# Patient Record
Sex: Female | Born: 1975 | Race: White | Hispanic: No | Marital: Single | Smoking: Current every day smoker
Health system: Southern US, Community
[De-identification: ages and names within clinical notes are randomized; demographics above are authoritative.]

## PROBLEM LIST (undated history)

## (undated) DIAGNOSIS — E538 Deficiency of other specified B group vitamins: Secondary | ICD-10-CM

## (undated) DIAGNOSIS — E669 Obesity, unspecified: Secondary | ICD-10-CM

## (undated) DIAGNOSIS — G8929 Other chronic pain: Secondary | ICD-10-CM

## (undated) DIAGNOSIS — K5090 Crohn's disease, unspecified, without complications: Secondary | ICD-10-CM

## (undated) DIAGNOSIS — K219 Gastro-esophageal reflux disease without esophagitis: Secondary | ICD-10-CM

## (undated) DIAGNOSIS — R109 Unspecified abdominal pain: Secondary | ICD-10-CM

## (undated) HISTORY — DX: Gastro-esophageal reflux disease without esophagitis: K21.9

---

## 1998-05-07 ENCOUNTER — Encounter: Payer: Self-pay | Admitting: Obstetrics and Gynecology

## 1998-05-07 ENCOUNTER — Ambulatory Visit (HOSPITAL_COMMUNITY): Admission: RE | Admit: 1998-05-07 | Discharge: 1998-05-07 | Payer: Self-pay | Admitting: Obstetrics and Gynecology

## 1998-08-13 ENCOUNTER — Inpatient Hospital Stay (HOSPITAL_COMMUNITY): Admission: AD | Admit: 1998-08-13 | Discharge: 1998-08-13 | Payer: Self-pay | Admitting: Obstetrics and Gynecology

## 1998-08-14 ENCOUNTER — Inpatient Hospital Stay (HOSPITAL_COMMUNITY): Admission: AD | Admit: 1998-08-14 | Discharge: 1998-08-14 | Payer: Self-pay | Admitting: Obstetrics and Gynecology

## 1998-09-10 ENCOUNTER — Ambulatory Visit (HOSPITAL_COMMUNITY): Admission: RE | Admit: 1998-09-10 | Discharge: 1998-09-10 | Payer: Self-pay | Admitting: Obstetrics and Gynecology

## 1998-09-15 ENCOUNTER — Inpatient Hospital Stay (HOSPITAL_COMMUNITY): Admission: AD | Admit: 1998-09-15 | Discharge: 1998-09-15 | Payer: Self-pay | Admitting: Obstetrics and Gynecology

## 1998-09-17 ENCOUNTER — Inpatient Hospital Stay (HOSPITAL_COMMUNITY): Admission: AD | Admit: 1998-09-17 | Discharge: 1998-09-20 | Payer: Self-pay | Admitting: Obstetrics and Gynecology

## 1998-09-22 ENCOUNTER — Encounter: Payer: Self-pay | Admitting: Obstetrics and Gynecology

## 1998-09-22 ENCOUNTER — Emergency Department (HOSPITAL_COMMUNITY): Admission: EM | Admit: 1998-09-22 | Discharge: 1998-09-22 | Payer: Self-pay | Admitting: Emergency Medicine

## 2000-08-08 ENCOUNTER — Other Ambulatory Visit: Admission: RE | Admit: 2000-08-08 | Discharge: 2000-08-08 | Payer: Self-pay | Admitting: Obstetrics and Gynecology

## 2001-04-21 ENCOUNTER — Encounter: Payer: Self-pay | Admitting: Obstetrics and Gynecology

## 2001-04-21 ENCOUNTER — Ambulatory Visit (HOSPITAL_COMMUNITY): Admission: RE | Admit: 2001-04-21 | Discharge: 2001-04-21 | Payer: Self-pay | Admitting: Obstetrics and Gynecology

## 2001-08-17 ENCOUNTER — Ambulatory Visit (HOSPITAL_COMMUNITY): Admission: RE | Admit: 2001-08-17 | Discharge: 2001-08-17 | Payer: Self-pay | Admitting: Obstetrics and Gynecology

## 2001-08-17 ENCOUNTER — Encounter: Payer: Self-pay | Admitting: Obstetrics and Gynecology

## 2001-08-26 ENCOUNTER — Inpatient Hospital Stay (HOSPITAL_COMMUNITY): Admission: AD | Admit: 2001-08-26 | Discharge: 2001-08-26 | Payer: Self-pay | Admitting: Obstetrics and Gynecology

## 2001-08-31 ENCOUNTER — Observation Stay (HOSPITAL_COMMUNITY): Admission: AD | Admit: 2001-08-31 | Discharge: 2001-09-01 | Payer: Self-pay | Admitting: Obstetrics and Gynecology

## 2001-09-05 ENCOUNTER — Encounter (INDEPENDENT_AMBULATORY_CARE_PROVIDER_SITE_OTHER): Payer: Self-pay

## 2001-09-05 ENCOUNTER — Inpatient Hospital Stay (HOSPITAL_COMMUNITY): Admission: AD | Admit: 2001-09-05 | Discharge: 2001-09-08 | Payer: Self-pay | Admitting: Obstetrics and Gynecology

## 2001-10-23 ENCOUNTER — Other Ambulatory Visit: Admission: RE | Admit: 2001-10-23 | Discharge: 2001-10-23 | Payer: Self-pay | Admitting: Obstetrics and Gynecology

## 2007-12-08 ENCOUNTER — Emergency Department (HOSPITAL_COMMUNITY): Admission: EM | Admit: 2007-12-08 | Discharge: 2007-12-09 | Payer: Self-pay | Admitting: Emergency Medicine

## 2008-03-29 HISTORY — PX: LAPAROSCOPIC CHOLECYSTECTOMY: SUR755

## 2008-08-27 ENCOUNTER — Ambulatory Visit (HOSPITAL_COMMUNITY): Admission: RE | Admit: 2008-08-27 | Discharge: 2008-08-27 | Payer: Self-pay | Admitting: Obstetrics and Gynecology

## 2008-08-27 ENCOUNTER — Encounter (INDEPENDENT_AMBULATORY_CARE_PROVIDER_SITE_OTHER): Payer: Self-pay | Admitting: Obstetrics and Gynecology

## 2008-10-15 ENCOUNTER — Emergency Department (HOSPITAL_COMMUNITY): Admission: EM | Admit: 2008-10-15 | Discharge: 2008-10-15 | Payer: Self-pay | Admitting: Emergency Medicine

## 2008-11-21 ENCOUNTER — Inpatient Hospital Stay (HOSPITAL_COMMUNITY): Admission: AD | Admit: 2008-11-21 | Discharge: 2008-11-21 | Payer: Self-pay | Admitting: Obstetrics and Gynecology

## 2009-03-29 DIAGNOSIS — E669 Obesity, unspecified: Secondary | ICD-10-CM

## 2009-03-29 HISTORY — DX: Obesity, unspecified: E66.9

## 2009-08-22 ENCOUNTER — Encounter: Payer: Self-pay | Admitting: Internal Medicine

## 2009-09-02 ENCOUNTER — Encounter: Payer: Self-pay | Admitting: Internal Medicine

## 2009-09-03 ENCOUNTER — Encounter: Payer: Self-pay | Admitting: Internal Medicine

## 2009-09-05 ENCOUNTER — Encounter: Payer: Self-pay | Admitting: Internal Medicine

## 2009-09-19 ENCOUNTER — Ambulatory Visit: Payer: Self-pay | Admitting: Internal Medicine

## 2009-09-19 DIAGNOSIS — R079 Chest pain, unspecified: Secondary | ICD-10-CM

## 2009-09-19 DIAGNOSIS — R0602 Shortness of breath: Secondary | ICD-10-CM

## 2009-09-22 ENCOUNTER — Encounter: Payer: Self-pay | Admitting: Internal Medicine

## 2009-09-22 LAB — CONVERTED CEMR LAB
BUN: 9 mg/dL (ref 6–23)
Chloride: 104 meq/L (ref 96–112)
Eosinophils Relative: 3 % (ref 0–5)
Glucose, Bld: 93 mg/dL (ref 70–99)
HCT: 34.2 % — ABNORMAL LOW (ref 36.0–46.0)
Hemoglobin: 11.5 g/dL — ABNORMAL LOW (ref 12.0–15.0)
Lymphocytes Relative: 24 % (ref 12–46)
Lymphs Abs: 2.3 10*3/uL (ref 0.7–4.0)
Monocytes Absolute: 0.4 10*3/uL (ref 0.1–1.0)
Monocytes Relative: 4 % (ref 3–12)
Potassium: 4.9 meq/L (ref 3.5–5.3)
RBC: 3.83 M/uL — ABNORMAL LOW (ref 3.87–5.11)
Sodium: 138 meq/L (ref 135–145)
WBC: 9.6 10*3/uL (ref 4.0–10.5)

## 2010-04-19 ENCOUNTER — Encounter: Payer: Self-pay | Admitting: Obstetrics and Gynecology

## 2010-04-28 NOTE — Assessment & Plan Note (Signed)
Summary: NP6/SOB/CHEST PAIN/JML   Visit Type:  Initial Consult Referring Provider:  dr Gabriel Cirri Primary Provider:  dr Gabriel Cirri   History of Present Illness: Patient is  a 35 year old who was referred for evaluation of chest tightness.   The patient has no known history of CAD.  She said since March or April she has noticed that she has not felt well.  She is more tired, gets short of breath easy.  Feels heaviness in her chest that will last all day. Also notes LE edema.  IN the last week she has gained 8 lbs. No history of recent viral URI.  No palpitations.  No syncope. Outside records show that she underwent a myovew.  Rest images only   There was an apical, anteroseptal and infreior defect.  The patient did not complete the study.  Current Medications (verified): 1)  Alprazolam 0.5 Mg Tabs (Alprazolam) .Marland Kitchen.. 1 Tab Two Times A Day 2)  Lamictal 25 Mg Tabs (Lamotrigine) .... 2 Tabs Once Daily 3)  Dexilant 60 Mg Cpdr (Dexlansoprazole) .Marland Kitchen.. 1 Tab Once Daily 4)  Byetta 5 Mcg Pen 5 Mcg/0.54ml Soln (Exenatide) .... Two Times A Day 5)  Pre Natal Vitamin .Marland Kitchen.. 1 Tab Once Daily  Allergies (verified): 1)  ! Sulfa  Past History:  Family History: Last updated: 09/19/2009 Coronary Artery disease HTN Grandmother with CHF Father with MI at 27.  Hx of CHF.  Social History: Last updated: 09/19/2009 tobacco use marital status Single caffeine use no alcohol use no drug use 2 children  Past Medical History: migraine obesity disorder, depressive, bipolar rhinitis allergic  esophageal reflux anxiety  Past Surgical History: Gallbladder-02/28/2009 Tubal Ligation  Family History: Coronary Artery disease HTN Grandmother with CHF Father with MI at 16.  Hx of CHF.  Review of Systems       All systems reviewed.  Neg to the above problem.  Vital Signs:  Patient profile:   35 year old female Height:      63 inches Weight:      314 pounds BMI:     55.82 Pulse rate:    90 / minute BP sitting:   120 / 73  (left arm) Cuff size:   large  Vitals Entered By: Burnett Kanaris, CNA (September 19, 2009 3:13 PM)  Physical Exam  Additional Exam:  Patient is a morbidly obese 35 year old. HEENT:  Normocephalic, atraumatic. EOMI, PERRLA.  Neck: JVP is normal. No thyromegaly. No bruits.  Lungs: clear to auscultation. No rales no wheezes.  Heart: Regular rate and rhythm. Normal S1, S2. No S3.   No significant murmurs. PMI not displaced.  Abdomen:  Supple, nontender. Normal bowel sounds. No masses. No hepatomegaly.  Extremities:   Good distal pulses throughout. Triviial lower extremity edema.  Musculoskeletal :moving all extremities.  Neuro:   alert and oriented x3.    EKG  Procedure date:  09/19/2009  Findings:      NSR.  84 bpm.  Impression & Recommendations:  Problem # 1:  SHORTNESS OF BREATH (ICD-786.05) Patients volume status does not look too bad today.   Records with abnormal rest myoview.  I may reflect soft tissue. For now I would recommend an echocardiogram, EKG and labs to work up. I would not add any medinces for now.  Problem # 2:  CHEST PAIN UNSPECIFIED (ICD-786.50) As noted above.  Will f/u with patient re test results and how to proceed. Orders: EKG w/ Interpretation (93000) Echocardiogram (Echo)  Other Orders:  T-Basic Metabolic Panel [id number]) T-CBC No Diff ([id number]) T-BNP  (B Natriuretic Peptide) ([id number]) T-TSH ([id number]) T-2 View CXR (71020TC)  Patient Instructions: 1)  Your physician recommends that you return for lab work in: lab work today..will call you with results. 2)  Your physician has requested that you have an echocardiogram.  Echocardiography is a painless test that uses sound waves to create images of your heart. It provides your doctor with information about the size and shape of your heart and how well your heart's chambers and valves are working.  This procedure takes approximately one hour. There are no  restrictions for this procedure. will call you with results 3)  A chest x-ray takes a picture of the organs and structures inside the chest, including the heart, lungs, and blood vessels. This test can show several things, including, whether the heart is enlarged; whether fluid is building up in the lungs; and whether pacemaker / defibrillator leads are still in place.   Appended Document: NP6/SOB/CHEST PAIN/JML Patient cancelled one scheduled echo and no showed for the next one. Will let Dr.Jashae Wiggs know.

## 2010-04-28 NOTE — Letter (Signed)
Summary: [facility] - Internal Medicine  [facility] - Internal Medicine   Imported By: Marylou Mccoy 10/08/2009 14:20:49  _____________________________________________________________________  External Attachment:    Type:   Image     Comment:   External Document

## 2010-04-28 NOTE — Letter (Signed)
Summary: [facility]   Imported By: Marylou Mccoy 09/18/2009 16:17:10  _____________________________________________________________________  External Attachment:    Type:   Image     Comment:   External Document

## 2010-04-28 NOTE — Consult Note (Signed)
Summary: [facility]   Imported By: Marylou Mccoy 09/18/2009 16:09:29  _____________________________________________________________________  External Attachment:    Type:   Image     Comment:   External Document

## 2010-04-28 NOTE — Consult Note (Signed)
Summary: [facility]   Imported By: Marylou Mccoy 09/18/2009 16:00:34  _____________________________________________________________________  External Attachment:    Type:   Image     Comment:   External Document

## 2010-04-28 NOTE — Letter (Signed)
Summary: [facility] - EKG  [facility] - EKG   Imported By: Marylou Mccoy 09/18/2009 16:07:46  _____________________________________________________________________  External Attachment:    Type:   Image     Comment:   External Document

## 2010-04-28 NOTE — Letter (Signed)
Summary: [facility]   Imported By: Marylou Mccoy 10/08/2009 14:26:46  _____________________________________________________________________  External Attachment:    Type:   Image     Comment:   External Document

## 2010-07-04 LAB — GC/CHLAMYDIA PROBE AMP, GENITAL
Chlamydia, DNA Probe: NEGATIVE
GC Probe Amp, Genital: NEGATIVE

## 2010-07-04 LAB — URINALYSIS, ROUTINE W REFLEX MICROSCOPIC
Bilirubin Urine: NEGATIVE
Glucose, UA: NEGATIVE mg/dL
Ketones, ur: NEGATIVE mg/dL
Nitrite: NEGATIVE
Protein, ur: NEGATIVE mg/dL
Specific Gravity, Urine: >1.03 — ABNORMAL HIGH (ref 1.005–1.030)

## 2010-07-04 LAB — COMPREHENSIVE METABOLIC PANEL
Alkaline Phosphatase: 69 U/L (ref 39–117)
BUN: 11 mg/dL (ref 6–23)
Creatinine, Ser: 0.63 mg/dL (ref 0.4–1.2)
Glucose, Bld: 110 mg/dL — ABNORMAL HIGH (ref 70–99)
Potassium: 4 mEq/L (ref 3.5–5.1)
Total Bilirubin: 0.4 mg/dL (ref 0.3–1.2)
Total Protein: 6.7 g/dL (ref 6.0–8.3)

## 2010-07-04 LAB — WET PREP, GENITAL: Clue Cells Wet Prep HPF POC: NONE SEEN

## 2010-07-07 LAB — CBC
HCT: 30.5 % — ABNORMAL LOW (ref 36.0–46.0)
Hemoglobin: 10.5 g/dL — ABNORMAL LOW (ref 12.0–15.0)
MCHC: 34.4 g/dL (ref 30.0–36.0)
MCV: 81.4 fL (ref 78.0–100.0)
RDW: 14.7 % (ref 11.5–15.5)

## 2010-07-07 LAB — BASIC METABOLIC PANEL
CO2: 28 mEq/L (ref 19–32)
Glucose, Bld: 101 mg/dL — ABNORMAL HIGH (ref 70–99)
Potassium: 4.2 mEq/L (ref 3.5–5.1)
Sodium: 136 mEq/L (ref 135–145)

## 2010-08-11 NOTE — Op Note (Signed)
NAME:  Julie [name], Julie [name]                 ACCOUNT NO.:  [id number]   MEDICAL RECORD NO.:  [id number]          PATIENT TYPE:  AMB   LOCATION:  SDC                           FACILITY:  [facility]   PHYSICIAN:  Zenaida Niece, M.D.DATE OF BIRTH:  04/19/1975   DATE OF PROCEDURE:  08/27/2008  DATE OF DISCHARGE:                               OPERATIVE REPORT   PREOPERATIVE DIAGNOSES:  Pelvic pain and abnormal uterine bleeding.   POSTOPERATIVE DIAGNOSES:  Pelvic pain, abnormal uterine bleeding, and  pelvic adhesions.   PROCEDURES:  Laparoscopy with lysis of adhesions, hysteroscopy, dilation  and curettage, and NovaSure endometrial ablation.   SURGEON:  Zenaida Niece, MD   ANESTHESIA:  General endotracheal tube and paracervical block.   FINDINGS:  She had omentum adherent to the midline anterior abdominal  wall.  The uterus was adherent to the anterior pelvis, and there were  some adhesions around the left tube and ovary.  By hysteroscopy, the  uterine cavity was normal.  NovaSure device used a depth of 5.5 cm, a  width of 3.6 cm, and used 109 watts of power for 80 seconds.   SPECIMENS:  Endometrial curettings sent for routine pathology.   ESTIMATED BLOOD LOSS:  Minimal.   COMPLICATIONS:  None.   PROCEDURE IN DETAIL:  The patient was taken to the operating room and  placed in the dorsal supine position.  Her left arm was tucked to her  side.  General anesthesia was induced and legs were placed in mobile  stirrups.  Abdomen, perineum, and vagina were then prepped and draped in  the usual sterile fashion, bladder drained with a red Robinson catheter,  Hulka tenaculum was applied to the cervix for uterine manipulation.  Infraumbilical skin was then infiltrated with 0.25% Marcaine and a 0.75  cm vertical incision was made.  The Veress needle was inserted into the  peritoneal cavity and placement confirmed by the water drop test and  opening pressure of 7 mmHg.  CO2 gas was insufflated to a  pressure of 12  mmHg and the Veress needle was removed.  A 5-mm trocar was then  introduced with direct visualization with the laparoscope.  Upon  entering the abdominal cavity, the above-mentioned findings were noted.  I was able to get past the omental adhesions in the midline to visualize  the pelvis.  I was able to sweep around and place a 5-mm port on the  left side under direct visualization.  The adhesions of the uterus to  the anterior pelvis were taken down with a Harmonic Scalpel ACE.  This  was done hugging the uterus so as not to injure the bladder.  All  significant adhesions were taken down.  In order to adequately free the  uterus, I believe I did take down the left round ligament.  There was  still some adhesions of the left tube and ovary.  However, it appeared  that in order to adequately take down these adhesions, I would have to  take down the infundibulopelvic ligament thus sacrificing the left tube  and ovary, so a few  filmy adhesions were taken down, but the ovary was  still a little bit suspended when I decided that nothing else could be  done for that.  Right tube and ovary appeared normal and there were no  adhesions posteriorly.  All sites were hemostatic and the anterior  uterus was free.  I put the scope through the port on the left side to  visualize the omental adhesions.  In torquing to do this, I believe I  fractured the optics of the scope.  I do not feel that the omental  adhesions were cause of her pain, so I decided to leave them.  Trocars  were removed after all gas was allowed to deflate from the abdomen.  Skin incisions were closed with interrupted subcuticular sutures of 4-0  Vicryl followed by Dermabond.   Attention was turned vaginally.  Legs were elevated in the stirrups.  A  Graves speculum was inserted into the vagina and the anterior lip of the  cervix was grasped with a single-tooth tenaculum.  The patient was given  Toradol IV.  A deep  paracervical block was performed with a total of 16  mL of 2% plain lidocaine.  The uterus sounded to 9.5 cm.  The cervix was  gradually easily dilated to accommodate the size #7 Hegar dilator which  measured the cervix of 4 cm.  The observer hysteroscope was inserted.  There was a fair amount of tissue noticed initially.  The hysteroscope  was removed.  Sharp curettage was performed with return of a moderate  amount of tissue.  The hysteroscope was then replaced and adequate  visualization was achieved.  The uterine cavity appeared normal with no  significant endometrial lesion.  The hysteroscope was removed.  The  NovaSure device was prepared.  The cervix was further dilated to a size  #8 Hegar dilator.  The NovaSure device was inserted and deployed  appropriately.  CO2 test passed and endometrial ablation was performed  without difficulty with the above-mentioned settings.  At the end of the  procedure, the device was allowed to cool and then was removed without  difficulty.  Hysteroscopy was then performed again and revealed a normal  uterine cavity with good global ablation.  Fluid was evacuated and the  hysteroscope was removed.  The single-tooth tenaculum on the anterior  cervix was removed and bleeding controlled with pressure.  All  instruments were then removed from the vagina.  The patient was then  taken down from stirrups.  She tolerated the procedure well, was  extubated in the operating room, and taken to the recovery room in  stable condition.  Counts were correct x2 and she had PAS hose on  throughout the procedure.      Zenaida Niece, M.D.  Electronically Signed     TDM/MEDQ  D:  08/27/2008  T:  08/27/2008  Job:  [id number]

## 2010-08-14 NOTE — Discharge Summary (Signed)
[facility]  Patient:    Julie [name], Julie [name] A Visit Number: [id number] MRN: [id number]          Service Type: OBS Location: 910A [id number] [id number] Attending Physician:  Michaele Offer Dictated by:   Zenaida Niece, M.D. Admit Date:  09/05/2001 Discharge Date: 09/08/2001                             Discharge Summary  ADMISSION DIAGNOSES:          1. Intrauterine pregnancy at 38 weeks.                               2. Group B Strep carrier.                               3. Prior shoulder dystocia.  DISCHARGE DIAGNOSES:          1. Intrauterine pregnancy at 38 weeks.                               2. Group B Strep carrier.                               3. Prior shoulder dystocia.                               4. Arrest of dilation.                               5. Fetal macrosomia.                               6. Desires surgical sterility.  PROCEDURE:                    Primary low transverse cesarean section and tubal ligation.  HISTORY OF PRESENT ILLNESS:   This is a 35 year old white female, gravida 2, para 1-0-0-1, with an EGA of 38+ by an LMP consistent with a 19-week ultrasound with a due date of June 19, who presents for induction #2 due to a favorable cervical and pelvic pressure.  The patient was admitted on June 5 for induction and had 12+ hours of high dose Pitocin without significant cervical change and no significant discomfort, so she was sent home.   She has had no problems since discharge.  Prenatal care complicated by anemia treated with iron.  An ultrasound on May 22, for size greater than dates revealed an estimated fetal weight of approximately 3300 grams with an AFI of 21.8.  PRENATAL LABORATORY DATA:     Blood type is B positive with a negative antibody screen, RPR nonreactive, rubella immune, hepatitis B surface antigen negative, HIV negative, gonorrhea and Chlamydia negative.  Triple screen normal. One-hour Glucola is 110,  Group B Strep is positive.  PAST OBSTETRIC HISTORY:       In June of 2000 induced vaginal delivery at 37 weeks, 8 pounds 13 ounces complicated by PIH, LGA infant, and shoulder dystocia.  PAST MEDICAL HISTORY:  Obesity and migraine headaches.  PAST SURGICAL HISTORY:        Tonsillectomy.  ALLERGIES:                    SEPTRA.  MEDICATIONS:                  Iron.  PHYSICAL EXAMINATION:         VITAL SIGNS: She is afebrile with stable vital signs.  Weight is approximately 300 pounds.  Fetal heart rate tracing was reactive with occasional contractions.  ABDOMEN: Obese, gravid, and nontender with an estimated fetal weight of 8 pounds.  PELVIC: Vaginal examination was 4, 70, -1 with a vertex presentation and an adequate pelvis.  Amniotomy revealed clear fluid.  HOSPITAL COURSE:              The patient was admitted and had the above mentioned amniotomy for induction.  She was started on penicillin for Group B Strep prophylaxis.  She progressed in active labor with the aid of Pitocin and received an epidural.  She had an IUPC placed to help monitor her contractions as they were not picking up well due to her size.  She progressed to 8 cm and had no significant change over 4+ hours with adequate contractions.  At this time we elected to proceed with a cesarean section for arrest of dilation.  On the evening of June 10, she had a primary low transverse cesarean section with tubal ligation done under epidural anesthesia. Estimated blood loss was 800 cc. The patient had normal anatomy and delivered a viable female infant with Apgars of 8 and 9 that weighed 10 pounds 5 ounces.  Postoperatively, she did very well.  She was rapidly able to ambulate and tolerate a regular diet. She remained afebrile.  Preoperative hemoglobin was 9.5, postoperative was 8.1. On the morning of postoperative day #3, she requested discharge home.  Her incision was healing well and she is felt to be stable  enough for discharge.  DISCHARGE INSTRUCTIONS:       Diet is a regular diet, activity is pelvic rest, no strenuous activity, and no driving.  FOLLOW-UP:                    In three days for incision check.  DISCHARGE MEDICATIONS:        Darvocet and ibuprofen p.r.n. pain. Over-the-counter iron b.i.d.  She is given our discharge pamphlet. Dictated by:   Zenaida Niece, M.D. Attending Physician:  Michaele Offer DD:  09/08/01 TD:  09/10/01 Job: [phone]

## 2010-08-14 NOTE — Op Note (Signed)
[facility]  Patient:    Julie [name], Julie [name] Visit Number: [id number] MRN: [id number]          Service Type: OBS Location: 910A 9142 [id number] Attending Physician:  Michaele Offer Dictated by:   Zenaida Niece, M.D. Proc. Date: 09/05/01 Admit Date:  09/05/2001                             Operative Report  PREOPERATIVE DIAGNOSES:       1. Intrauterine pregnancy at 38 weeks.                               2. Arrest of dilation.                               3. Fetal macrosomia.                               4. Desires surgical sterility.  POSTOPERATIVE DIAGNOSES:      1. Intrauterine pregnancy at 38 weeks.                               2. Arrest of dilation.                               3. Fetal macrosomia.                               4. Desires surgical sterility.  OPERATION:                    Primary low transverse cesarean section and                               bilateral partial salpingectomy.  SURGEON:                      Zenaida Niece, M.D.  ANESTHESIA:                   Epidural.  ESTIMATED BLOOD LOSS:         800 cc.  FINDINGS:                     Normal female anatomy and delivery of [name] viable female infant with Apgars of 8 and 9, and weighed 10 pounds and 5 ounces.  DESCRIPTION OF PROCEDURE:     The patient was taken to the operating room and placed in the dorsal supine position with [name] left lateral tilt.  Her previously placed epidural was dosed appropriately, and her abdomen was prepped and draped in the usual sterile fashion after her abdominal wall was taped up to an ether screen.  The level of her anesthesia was found to be adequate and her abdomen was entered via [name] standard Pfannenstiel incision.  The vesicouterine peritoneum was incised and bladder flap created digitally.  [name] 4 cm transverse incision was made in the lower uterine segment.  The amniotic cavity was entered bluntly and clear amniotic fluid was noted.  The uterine incision was extended bilaterally and digitally and for [name] short distance with bandaged scissors.  The fetal vertex was then grasped and delivered through the incision atraumatically.  The mouth and nares were suctioned and the remainder of the infant also delivered atraumatically.  The cord was doubly clamped and cut, and the infant handed to the awaiting pediatric team.  Cord blood and [name] segment of cord for gas were obtained, and the placenta delivered spontaneously.  The uterus was then exteriorized and wiped dry with [name] clean lap pad.  The incision was inspected and found to be free of extensions.  It was then closed in one layer being [name] running locking layer with #1 chromic. Bleeding from the left angle and to the right of the midline was controlled with figure-of-eight sutures of chromic with adequate hemostasis.  Bleeding from serosal edges was controlled with electrocautery.  Attention was turned to the tubal ligation.  On each side, [name] knuckle of tube was grasped with [name] Babcock clamp.  [name] vascular segment of mesosalpinx was incised with electrocautery.  [name] 0 plain gut suture was used to ligate [name] segment of each tube.  The segment was entered sharply.  Both ostia were identified and stumps were hemostatic.  The uterus was then returned to the abdominal cavity.  The uterine incision was again inspected and found to be hemostatic.  The tubal sites were found to be intact.  The subfascial space was then irrigated and made hemostatic with electrocautery.  The fascia was closed in [name] running fashion with 0 Vicryl starting at both ends and meeting in the middle.  The subcutaneous tissue was then irrigated and made hemostatic with electrocautery.  The subcutaneous tissue was closed with [name] running 2-0 plain gut suture.  The skin was then closed with staples and [name] sterile dressing.  The patient tolerated the procedure well.  She was given Cefotan 2 g IV at cord clamp, and all counts  were correct. Dictated by:   Zenaida Niece, M.D. Attending Physician:  Michaele Offer DD:  09/05/01 TD:  09/07/01 Job: [id number]

## 2010-12-30 LAB — COMPREHENSIVE METABOLIC PANEL
AST: 10
CO2: 25
Calcium: 8.8
Creatinine, Ser: 0.75
GFR calc Af Amer: >60
GFR calc non Af Amer: >60

## 2010-12-30 LAB — URINALYSIS, ROUTINE W REFLEX MICROSCOPIC
Bilirubin Urine: NEGATIVE
Hgb urine dipstick: NEGATIVE
Protein, ur: NEGATIVE
Urobilinogen, UA: 0.2

## 2010-12-30 LAB — DIFFERENTIAL
Eosinophils Relative: 2
Lymphocytes Relative: 28
Lymphs Abs: 2.4
Neutro Abs: 5.7

## 2010-12-30 LAB — CBC
MCHC: 33.7
MCV: 84.4
Platelets: 247
RBC: 3.98

## 2012-03-29 DIAGNOSIS — G8929 Other chronic pain: Secondary | ICD-10-CM

## 2012-03-29 DIAGNOSIS — K5090 Crohn's disease, unspecified, without complications: Secondary | ICD-10-CM

## 2012-03-29 HISTORY — DX: Crohn's disease, unspecified, without complications: K50.90

## 2012-03-29 HISTORY — DX: Other chronic pain: G89.29

## 2012-03-29 HISTORY — PX: COLON SURGERY: SHX602

## 2012-07-15 ENCOUNTER — Emergency Department (HOSPITAL_COMMUNITY)
Admission: EM | Admit: 2012-07-15 | Discharge: 2012-07-15 | Disposition: A | Discharge status: Home / Self Care | Payer: Medicaid Other | Attending: Emergency Medicine | Admitting: Emergency Medicine

## 2012-07-15 ENCOUNTER — Encounter (HOSPITAL_COMMUNITY): Payer: Self-pay | Admitting: *Deleted

## 2012-07-15 DIAGNOSIS — K219 Gastro-esophageal reflux disease without esophagitis: Secondary | ICD-10-CM | POA: Insufficient documentation

## 2012-07-15 DIAGNOSIS — R109 Unspecified abdominal pain: Secondary | ICD-10-CM

## 2012-07-15 DIAGNOSIS — R1013 Epigastric pain: Secondary | ICD-10-CM | POA: Insufficient documentation

## 2012-07-15 DIAGNOSIS — Z79899 Other long term (current) drug therapy: Secondary | ICD-10-CM | POA: Insufficient documentation

## 2012-07-15 DIAGNOSIS — Z3202 Encounter for pregnancy test, result negative: Secondary | ICD-10-CM | POA: Insufficient documentation

## 2012-07-15 HISTORY — DX: Gastro-esophageal reflux disease without esophagitis: K21.9

## 2012-07-15 LAB — COMPREHENSIVE METABOLIC PANEL
CO2: 27 mEq/L (ref 19–32)
Calcium: 9 mg/dL (ref 8.4–10.5)
Chloride: 100 mEq/L (ref 96–112)
Creatinine, Ser: 0.65 mg/dL (ref 0.50–1.10)
GFR calc Af Amer: >90 mL/min (ref >90)
GFR calc non Af Amer: >90 mL/min (ref >90)
Glucose, Bld: 110 mg/dL — ABNORMAL HIGH (ref 70–99)
Total Bilirubin: 0.2 mg/dL — ABNORMAL LOW (ref 0.3–1.2)

## 2012-07-15 LAB — URINALYSIS, MICROSCOPIC ONLY
Hgb urine dipstick: NEGATIVE
Ketones, ur: 15 mg/dL — AB
Leukocytes, UA: NEGATIVE
Protein, ur: NEGATIVE mg/dL
Urobilinogen, UA: 0.2 mg/dL (ref 0.0–1.0)

## 2012-07-15 LAB — CBC WITH DIFFERENTIAL/PLATELET
Eosinophils Relative: 1 % (ref 0–5)
HCT: 40.3 % (ref 36.0–46.0)
Hemoglobin: 14 g/dL (ref 12.0–15.0)
Lymphocytes Relative: 16 % (ref 12–46)
Lymphs Abs: 2.3 10*3/uL (ref 0.7–4.0)
MCV: 88.4 fL (ref 78.0–100.0)
Monocytes Absolute: 0.5 10*3/uL (ref 0.1–1.0)
Monocytes Relative: 3 % (ref 3–12)
RBC: 4.56 MIL/uL (ref 3.87–5.11)
RDW: 13.5 % (ref 11.5–15.5)
WBC: 14.4 10*3/uL — ABNORMAL HIGH (ref 4.0–10.5)

## 2012-07-15 LAB — PREGNANCY, URINE: Preg Test, Ur: NEGATIVE

## 2012-07-15 MED ORDER — ONDANSETRON HCL 4 MG/2ML IJ SOLN
4.0000 mg | Freq: Once | INTRAMUSCULAR | Status: AC
  Administered 2012-07-15: 4 mg via INTRAVENOUS
  Filled 2012-07-15: qty 2

## 2012-07-15 MED ORDER — ONDANSETRON HCL 8 MG PO TABS: 8.0000 mg | ORAL_TABLET | Freq: Three times a day (TID) | ORAL | Status: DC | PRN

## 2012-07-15 MED ORDER — SODIUM CHLORIDE 0.9 % IV BOLUS (SEPSIS): 1000.0000 mL | Freq: Once | INTRAVENOUS | Status: DC

## 2012-07-15 MED ORDER — FAMOTIDINE 20 MG PO TABS
20.0000 mg | ORAL_TABLET | Freq: Once | ORAL | Status: AC
  Administered 2012-07-15: 20 mg via ORAL
  Filled 2012-07-15: qty 1

## 2012-07-15 MED ORDER — HYDROMORPHONE HCL PF 1 MG/ML IJ SOLN
1.0000 mg | Freq: Once | INTRAMUSCULAR | Status: AC
  Administered 2012-07-15: 1 mg via INTRAVENOUS
  Filled 2012-07-15: qty 1

## 2012-07-15 MED ORDER — HYDROMORPHONE HCL PF 1 MG/ML IJ SOLN
0.5000 mg | Freq: Once | INTRAMUSCULAR | Status: AC
  Administered 2012-07-15: 0.5 mg via INTRAVENOUS
  Filled 2012-07-15: qty 1

## 2012-07-15 MED ORDER — HYDROCODONE-ACETAMINOPHEN 5-325 MG PO TABS: 1.0000 | ORAL_TABLET | Freq: Four times a day (QID) | ORAL | Status: DC | PRN

## 2012-07-15 MED ORDER — GI COCKTAIL ~~LOC~~
30.0000 mL | Freq: Once | ORAL | Status: AC
  Administered 2012-07-15: 30 mL via ORAL
  Filled 2012-07-15: qty 30

## 2012-07-15 NOTE — ED Provider Notes (Signed)
History     CSN: [id number]  Arrival date & time 07/15/12  1629   First MD Initiated Contact with Patient 07/15/12 1655      Chief Complaint  Patient presents with  . Abdominal Pain    (Consider location/radiation/quality/duration/timing/severity/associated sxs/prior treatment) Patient is a 37 y.o. female presenting with abdominal pain. The history is provided by the patient.  Abdominal Pain Associated symptoms: no chest pain, no chills, no constipation, no diarrhea, no dysuria, no fever, no hematuria, no shortness of breath, no vaginal bleeding, no vaginal discharge and no vomiting   pt c/o epigastric pain today, started this am at rest. Constant. Dull. Non radiating. No specific exacerbating or alleviating factors. No back or flank pain. Remote hx cholecystectomy. States pain somewhat similar to that type of pain. No hx pud or pancreatitis. No abd trauma or fall. No fever or chills. Nausea. No vomiting or diarrhea. Had normal bm today. No abd distension. No mid to lower abd pain. No cp or discomfort. No sob or unusual doe. No cough uri c/o. No fever or chills.     Past Medical History  Diagnosis Date  . Acid reflux     History reviewed. No pertinent past surgical history.  History reviewed. No pertinent family history.  History  Substance Use Topics  . Smoking status: Not on file  . Smokeless tobacco: Not on file  . Alcohol Use: No    OB History   Grav Para Term Preterm Abortions TAB SAB Ect Mult Living                  Review of Systems  Constitutional: Negative for fever and chills.  HENT: Negative for neck pain.   Eyes: Negative for redness.  Respiratory: Negative for shortness of breath.   Cardiovascular: Negative for chest pain.  Gastrointestinal: Positive for abdominal pain. Negative for vomiting, diarrhea, constipation and abdominal distention.  Genitourinary: Negative for dysuria, hematuria, flank pain, vaginal bleeding, vaginal discharge and pelvic pain.   Musculoskeletal: Negative for back pain.  Skin: Negative for rash.  Neurological: Negative for headaches.  Hematological: Does not bruise/bleed easily.  Psychiatric/Behavioral: Negative for confusion.    Allergies  Sulfonamide derivatives  Home Medications   Current Outpatient Rx  Name  Route  Sig  Dispense  Refill  . ALPRAZolam (XANAX) 0.5 MG tablet   Oral   Take 0.5 mg by mouth 2 (two) times daily as needed for anxiety.         Marland Kitchen esomeprazole (NEXIUM) 40 MG capsule   Oral   Take 40 mg by mouth daily before breakfast.         . ibuprofen (ADVIL,MOTRIN) 200 MG tablet   Oral   Take 400 mg by mouth every 6 (six) hours as needed for pain.         . phentermine (ADIPEX-P) 37.5 MG tablet   Oral   Take 37.5 mg by mouth daily before breakfast.         . simethicone (GAS-X) 80 MG chewable tablet   Oral   Chew 80 mg by mouth every 6 (six) hours as needed for flatulence.           BP 136/73  Pulse 117  Temp(Src) 98.8 F (37.1 C) (Oral)  Resp 22  SpO2 100%  Physical Exam  Nursing note and vitals reviewed. Constitutional: She appears well-developed and well-nourished. No distress.  HENT:  Nose: Nose normal.  Mouth/Throat: Oropharynx is clear and moist.  Eyes: Conjunctivae are  normal. No scleral icterus.  Neck: Neck supple. No tracheal deviation present.  Cardiovascular: Normal rate, regular rhythm, normal heart sounds and intact distal pulses.  Exam reveals no gallop and no friction rub.   No murmur heard. Pulmonary/Chest: Effort normal and breath sounds normal. No respiratory distress. She exhibits no tenderness.  Abdominal: Soft. Normal appearance and bowel sounds are normal. She exhibits no distension and no mass. There is tenderness. There is no rebound and no guarding.  Epigastric tenderness  Genitourinary:  No cva tenderness  Musculoskeletal: She exhibits no edema and no tenderness.  Neurological: She is alert.  Skin: Skin is warm and dry. No rash  noted.  Psychiatric: She has a normal mood and affect.    ED Course  Procedures (including critical care time)   Results for orders placed during the hospital encounter of 07/15/12  CBC WITH DIFFERENTIAL      Result Value Range   WBC 14.4 (*) 4.0 - 10.5 K/uL   RBC 4.56  3.87 - 5.11 MIL/uL   Hemoglobin 14.0  12.0 - 15.0 g/dL   HCT 16.1  09.6 - 04.5 %   MCV 88.4  78.0 - 100.0 fL   MCH 30.7  26.0 - 34.0 pg   MCHC 34.7  30.0 - 36.0 g/dL   RDW 40.9  81.1 - 91.4 %   Platelets 259  150 - 400 K/uL   Neutrophils Relative 80 (*) 43 - 77 %   Neutro Abs 11.5 (*) 1.7 - 7.7 K/uL   Lymphocytes Relative 16  12 - 46 %   Lymphs Abs 2.3  0.7 - 4.0 K/uL   Monocytes Relative 3  3 - 12 %   Monocytes Absolute 0.5  0.1 - 1.0 K/uL   Eosinophils Relative 1  0 - 5 %   Eosinophils Absolute 0.1  0.0 - 0.7 K/uL   Basophils Relative 0  0 - 1 %   Basophils Absolute 0.0  0.0 - 0.1 K/uL  COMPREHENSIVE METABOLIC PANEL      Result Value Range   Sodium 136  135 - 145 mEq/L   Potassium 4.0  3.5 - 5.1 mEq/L   Chloride 100  96 - 112 mEq/L   CO2 27  19 - 32 mEq/L   Glucose, Bld 110 (*) 70 - 99 mg/dL   BUN 10  6 - 23 mg/dL   Creatinine, Ser 7.82  0.50 - 1.10 mg/dL   Calcium 9.0  8.4 - 95.6 mg/dL   Total Protein 7.2  6.0 - 8.3 g/dL   Albumin 3.4 (*) 3.5 - 5.2 g/dL   AST 7  0 - 37 U/L   ALT 6  0 - 35 U/L   Alkaline Phosphatase 82  39 - 117 U/L   Total Bilirubin 0.2 (*) 0.3 - 1.2 mg/dL   GFR calc non Af Amer >90  >90 mL/min   GFR calc Af Amer >90  >90 mL/min  LIPASE, BLOOD      Result Value Range   Lipase 31  11 - 59 U/L  URINALYSIS, MICROSCOPIC ONLY      Result Value Range   Color, Urine YELLOW  YELLOW   APPearance CLOUDY (*) CLEAR   Specific Gravity, Urine 1.029  1.005 - 1.030   pH 5.0  5.0 - 8.0   Glucose, UA NEGATIVE  NEGATIVE mg/dL   Hgb urine dipstick NEGATIVE  NEGATIVE   Bilirubin Urine SMALL (*) NEGATIVE   Ketones, ur 15 (*) NEGATIVE mg/dL   Protein,  ur NEGATIVE  NEGATIVE mg/dL    Urobilinogen, UA 0.2  0.0 - 1.0 mg/dL   Nitrite NEGATIVE  NEGATIVE   Leukocytes, UA NEGATIVE  NEGATIVE   Bacteria, UA RARE  RARE   Squamous Epithelial / LPF MANY (*) RARE   Urine-Other MUCOUS PRESENT    PREGNANCY, URINE      Result Value Range   Preg Test, Ur NEGATIVE  NEGATIVE       MDM  Iv ns. Dilaudid 1 mg iv. zofran iv. Gi cocktail and pepcid po.  Reviewed nursing notes and prior charts for additional history.   Pt notes prior cholecystectomy for similar pain, although on review prior records and pt no gallstones on prior u/s's or at time of surgery.  ?whether abn hepatobil scan prior to surgery.  Pt also reports egds for same symptoms in past, negative.  Repeat abd exam soft, nt. No hernia. Normal bs.   Pt appears stable for d/c.          Suzi Roots, MD 07/15/12 [phone]

## 2013-02-21 DIAGNOSIS — K5090 Crohn's disease, unspecified, without complications: Secondary | ICD-10-CM | POA: Insufficient documentation

## 2014-10-28 HISTORY — PX: ESOPHAGOGASTRODUODENOSCOPY: SHX1529

## 2014-10-31 HISTORY — PX: COLONOSCOPY: SHX174

## 2014-11-28 DIAGNOSIS — E538 Deficiency of other specified B group vitamins: Secondary | ICD-10-CM

## 2014-11-28 HISTORY — DX: Deficiency of other specified B group vitamins: E53.8

## 2014-12-03 DIAGNOSIS — R1011 Right upper quadrant pain: Secondary | ICD-10-CM | POA: Insufficient documentation

## 2014-12-03 DIAGNOSIS — K219 Gastro-esophageal reflux disease without esophagitis: Secondary | ICD-10-CM | POA: Insufficient documentation

## 2015-09-30 ENCOUNTER — Inpatient Hospital Stay (HOSPITAL_COMMUNITY)
Admission: EM | Admit: 2015-09-30 | Discharge: 2015-10-02 | DRG: 387 | Disposition: A | Discharge status: Home / Self Care | Payer: Medicaid Other | Attending: Family Medicine | Admitting: Family Medicine

## 2015-09-30 ENCOUNTER — Encounter (HOSPITAL_COMMUNITY): Payer: Self-pay | Admitting: Emergency Medicine

## 2015-09-30 ENCOUNTER — Emergency Department (HOSPITAL_COMMUNITY): Payer: Medicaid Other

## 2015-09-30 DIAGNOSIS — Z8051 Family history of malignant neoplasm of kidney: Secondary | ICD-10-CM

## 2015-09-30 DIAGNOSIS — K50919 Crohn's disease, unspecified, with unspecified complications: Secondary | ICD-10-CM

## 2015-09-30 DIAGNOSIS — Z8249 Family history of ischemic heart disease and other diseases of the circulatory system: Secondary | ICD-10-CM

## 2015-09-30 DIAGNOSIS — K5090 Crohn's disease, unspecified, without complications: Secondary | ICD-10-CM | POA: Diagnosis not present

## 2015-09-30 DIAGNOSIS — E876 Hypokalemia: Secondary | ICD-10-CM | POA: Insufficient documentation

## 2015-09-30 DIAGNOSIS — K5000 Crohn's disease of small intestine without complications: Principal | ICD-10-CM | POA: Diagnosis present

## 2015-09-30 DIAGNOSIS — D649 Anemia, unspecified: Secondary | ICD-10-CM | POA: Diagnosis present

## 2015-09-30 DIAGNOSIS — Z9049 Acquired absence of other specified parts of digestive tract: Secondary | ICD-10-CM

## 2015-09-30 DIAGNOSIS — Z833 Family history of diabetes mellitus: Secondary | ICD-10-CM

## 2015-09-30 DIAGNOSIS — Z79899 Other long term (current) drug therapy: Secondary | ICD-10-CM

## 2015-09-30 DIAGNOSIS — F17210 Nicotine dependence, cigarettes, uncomplicated: Secondary | ICD-10-CM | POA: Diagnosis present

## 2015-09-30 HISTORY — DX: Obesity, unspecified: E66.9

## 2015-09-30 HISTORY — DX: Crohn's disease, unspecified, without complications: K50.90

## 2015-09-30 HISTORY — DX: Deficiency of other specified B group vitamins: E53.8

## 2015-09-30 HISTORY — DX: Other chronic pain: G89.29

## 2015-09-30 HISTORY — DX: Unspecified abdominal pain: R10.9

## 2015-09-30 LAB — COMPREHENSIVE METABOLIC PANEL
ALK PHOS: 76 U/L (ref 38–126)
ALT: 6 U/L — AB (ref 14–54)
AST: 7 U/L — AB (ref 15–41)
Albumin: 3.1 g/dL — ABNORMAL LOW (ref 3.5–5.0)
Anion gap: 7 (ref 5–15)
BUN: 7 mg/dL (ref 6–20)
CALCIUM: 8 mg/dL — AB (ref 8.9–10.3)
CO2: 28 mmol/L (ref 22–32)
CREATININE: 0.86 mg/dL (ref 0.44–1.00)
Chloride: 101 mmol/L (ref 101–111)
Glucose, Bld: 107 mg/dL — ABNORMAL HIGH (ref 65–99)
Potassium: 2.1 mmol/L — CL (ref 3.5–5.1)
SODIUM: 136 mmol/L (ref 135–145)
Total Bilirubin: 0.3 mg/dL (ref 0.3–1.2)
Total Protein: 6.8 g/dL (ref 6.5–8.1)

## 2015-09-30 LAB — URINALYSIS, ROUTINE W REFLEX MICROSCOPIC
BILIRUBIN URINE: NEGATIVE
GLUCOSE, UA: NEGATIVE mg/dL
HGB URINE DIPSTICK: NEGATIVE
KETONES UR: NEGATIVE mg/dL
Nitrite: NEGATIVE
PROTEIN: 30 mg/dL — AB
Specific Gravity, Urine: 1.021 (ref 1.005–1.030)
pH: 6 (ref 5.0–8.0)

## 2015-09-30 LAB — CBC
HCT: 33.7 % — ABNORMAL LOW (ref 36.0–46.0)
Hemoglobin: 11.2 g/dL — ABNORMAL LOW (ref 12.0–15.0)
MCH: 28.8 pg (ref 26.0–34.0)
MCHC: 33.2 g/dL (ref 30.0–36.0)
MCV: 86.6 fL (ref 78.0–100.0)
PLATELETS: 397 10*3/uL (ref 150–400)
RBC: 3.89 MIL/uL (ref 3.87–5.11)
RDW: 14.3 % (ref 11.5–15.5)
WBC: 10 10*3/uL (ref 4.0–10.5)

## 2015-09-30 LAB — URINE MICROSCOPIC-ADD ON

## 2015-09-30 LAB — LIPASE, BLOOD: Lipase: 32 U/L (ref 11–51)

## 2015-09-30 LAB — PREGNANCY, URINE: PREG TEST UR: NEGATIVE

## 2015-09-30 LAB — MAGNESIUM: MAGNESIUM: 1.6 mg/dL — AB (ref 1.7–2.4)

## 2015-09-30 MED ORDER — PANTOPRAZOLE SODIUM 40 MG PO TBEC
40.0000 mg | DELAYED_RELEASE_TABLET | Freq: Every day | ORAL | Status: DC
  Administered 2015-10-01 – 2015-10-02 (×2): 40 mg via ORAL
  Filled 2015-09-30 (×2): qty 1

## 2015-09-30 MED ORDER — HYDROXYZINE HCL 25 MG PO TABS: 50.0000 mg | ORAL_TABLET | Freq: Two times a day (BID) | ORAL | Status: DC | PRN

## 2015-09-30 MED ORDER — METOCLOPRAMIDE HCL 5 MG/ML IJ SOLN
10.0000 mg | Freq: Once | INTRAMUSCULAR | Status: AC
  Administered 2015-09-30: 10 mg via INTRAVENOUS
  Filled 2015-09-30: qty 2

## 2015-09-30 MED ORDER — ONDANSETRON HCL 4 MG/2ML IJ SOLN
4.0000 mg | Freq: Once | INTRAMUSCULAR | Status: AC
  Administered 2015-09-30: 4 mg via INTRAVENOUS
  Filled 2015-09-30: qty 2

## 2015-09-30 MED ORDER — ONDANSETRON HCL 4 MG PO TABS: 4.0000 mg | ORAL_TABLET | Freq: Four times a day (QID) | ORAL | Status: DC | PRN

## 2015-09-30 MED ORDER — AZATHIOPRINE 50 MG PO TABS
50.0000 mg | ORAL_TABLET | Freq: Every day | ORAL | Status: DC
  Administered 2015-10-01 – 2015-10-02 (×2): 50 mg via ORAL
  Filled 2015-09-30 (×4): qty 1

## 2015-09-30 MED ORDER — ONDANSETRON HCL 4 MG/2ML IJ SOLN
4.0000 mg | Freq: Four times a day (QID) | INTRAMUSCULAR | Status: DC | PRN
  Administered 2015-09-30 – 2015-10-01 (×2): 4 mg via INTRAVENOUS
  Filled 2015-09-30 (×2): qty 2

## 2015-09-30 MED ORDER — POTASSIUM CHLORIDE IN NACL 20-0.9 MEQ/L-% IV SOLN
INTRAVENOUS | Status: AC
  Administered 2015-09-30: 1000 mL via INTRAVENOUS
  Administered 2015-10-01: 100 mL via INTRAVENOUS
  Administered 2015-10-01: 07:00:00 via INTRAVENOUS
  Filled 2015-09-30 (×4): qty 1000

## 2015-09-30 MED ORDER — POTASSIUM CHLORIDE CRYS ER 20 MEQ PO TBCR
40.0000 meq | EXTENDED_RELEASE_TABLET | Freq: Once | ORAL | Status: AC
  Administered 2015-09-30: 40 meq via ORAL
  Filled 2015-09-30 (×2): qty 2

## 2015-09-30 MED ORDER — MORPHINE SULFATE (PF) 2 MG/ML IV SOLN
1.0000 mg | INTRAVENOUS | Status: DC | PRN
  Administered 2015-09-30 – 2015-10-02 (×7): 1 mg via INTRAVENOUS
  Filled 2015-09-30 (×11): qty 1

## 2015-09-30 MED ORDER — PREDNISONE 20 MG PO TABS
60.0000 mg | ORAL_TABLET | Freq: Once | ORAL | Status: AC
  Administered 2015-09-30: 60 mg via ORAL
  Filled 2015-09-30: qty 3

## 2015-09-30 MED ORDER — POTASSIUM CHLORIDE 10 MEQ/100ML IV SOLN
10.0000 meq | INTRAVENOUS | Status: AC
  Administered 2015-09-30 (×2): 10 meq via INTRAVENOUS
  Filled 2015-09-30 (×2): qty 100

## 2015-09-30 MED ORDER — KETOROLAC TROMETHAMINE 30 MG/ML IJ SOLN
30.0000 mg | Freq: Once | INTRAMUSCULAR | Status: AC
  Administered 2015-09-30: 30 mg via INTRAVENOUS
  Filled 2015-09-30: qty 1

## 2015-09-30 MED ORDER — POTASSIUM CHLORIDE 10 MEQ/100ML IV SOLN
10.0000 meq | INTRAVENOUS | Status: DC
  Administered 2015-09-30: 10 meq via INTRAVENOUS
  Filled 2015-09-30 (×5): qty 100

## 2015-09-30 MED ORDER — POTASSIUM CHLORIDE 10 MEQ/100ML IV SOLN
10.0000 meq | INTRAVENOUS | Status: AC
  Administered 2015-09-30 – 2015-10-01 (×5): 10 meq via INTRAVENOUS
  Filled 2015-09-30 (×5): qty 100

## 2015-09-30 MED ORDER — DIATRIZOATE MEGLUMINE & SODIUM 66-10 % PO SOLN
15.0000 mL | Freq: Once | ORAL | Status: AC
  Administered 2015-09-30: 15 mL via ORAL

## 2015-09-30 MED ORDER — SODIUM CHLORIDE 0.9 % IV BOLUS (SEPSIS)
1000.0000 mL | Freq: Once | INTRAVENOUS | Status: AC
  Administered 2015-09-30: 1000 mL via INTRAVENOUS

## 2015-09-30 MED ORDER — IOPAMIDOL (ISOVUE-300) INJECTION 61%
100.0000 mL | Freq: Once | INTRAVENOUS | Status: AC | PRN
  Administered 2015-09-30: 100 mL via INTRAVENOUS

## 2015-09-30 MED ORDER — MELATONIN 10 MG PO TABS: 10.0000 mg | ORAL_TABLET | Freq: Every day | ORAL | Status: DC

## 2015-09-30 MED ORDER — PREDNISONE 20 MG PO TABS
40.0000 mg | ORAL_TABLET | Freq: Every day | ORAL | Status: DC
  Administered 2015-10-01 – 2015-10-02 (×2): 40 mg via ORAL
  Filled 2015-09-30 (×2): qty 2

## 2015-09-30 MED ORDER — ACETAMINOPHEN 650 MG RE SUPP: 650.0000 mg | Freq: Four times a day (QID) | RECTAL | Status: DC | PRN

## 2015-09-30 MED ORDER — ACETAMINOPHEN 325 MG PO TABS: 650.0000 mg | ORAL_TABLET | Freq: Four times a day (QID) | ORAL | Status: DC | PRN

## 2015-09-30 MED ORDER — SIMETHICONE 80 MG PO CHEW: 80.0000 mg | CHEWABLE_TABLET | Freq: Four times a day (QID) | ORAL | Status: DC | PRN

## 2015-09-30 MED ORDER — SODIUM CHLORIDE 0.9 % IV SOLN
INTRAVENOUS | Status: DC
  Administered 2015-09-30: 19:00:00 via INTRAVENOUS

## 2015-09-30 MED ORDER — HYDROMORPHONE HCL 1 MG/ML IJ SOLN
1.0000 mg | Freq: Once | INTRAMUSCULAR | Status: AC
  Administered 2015-09-30: 1 mg via INTRAVENOUS
  Filled 2015-09-30: qty 1

## 2015-09-30 MED ORDER — POTASSIUM CHLORIDE CRYS ER 20 MEQ PO TBCR
40.0000 meq | EXTENDED_RELEASE_TABLET | Freq: Once | ORAL | Status: AC
  Administered 2015-09-30: 40 meq via ORAL
  Filled 2015-09-30: qty 2

## 2015-09-30 NOTE — ED Notes (Signed)
Per patient, she has a history of Chron's disease.  Patient is complaining of upper abdominal pain that radiates to the right side.  Has some n/v/d.  She states no medication has helped with the pain.

## 2015-09-30 NOTE — ED Provider Notes (Signed)
CSN: [id number]     Arrival date & time 09/30/15  1515 History   First MD Initiated Contact with Patient 09/30/15 [phone]     Chief Complaint  Patient presents with  . Abdominal Pain     (Consider location/radiation/quality/duration/timing/severity/associated sxs/prior Treatment) Patient is a 40 y.o. female presenting with abdominal pain. The history is provided by the patient.  Abdominal Pain Associated symptoms: chills, diarrhea (chronic), fever (resolved) and nausea   Associated symptoms: no chest pain, no cough, no dysuria, no hematemesis, no hematochezia, no hematuria, no melena, no shortness of breath and no vomiting    Julie [name] is a 40 y.o. female with PMH significant for Crohn's disease with bowel resection 2014 after an obstruction and hx of cholecystectomy and appendectomy who presents with intermittent, episodic, sharp epigastric abdominal pain that radiates to the RUQ since 6 PM yesterday.  Taken Tylenol without relief.  No modifying factors.  Associated symptoms include N/V/D.  She states that her diarrhea is chronic.  No bloody stools. She states she had a fever a couple of days ago that resolved along with chills.  No urinary symptoms, vaginal discharge, CP, or SOB.   Past Medical History  Diagnosis Date  . Acid reflux    Past Surgical History  Procedure Laterality Date  . Abdominal surgery     No family history on file. Social History  Substance Use Topics  . Smoking status: Current Every Day Smoker -- 1.00 packs/day  . Smokeless tobacco: Never Used  . Alcohol Use: No   OB History    No data available     Review of Systems  Constitutional: Positive for fever (resolved) and chills.  Respiratory: Negative for cough and shortness of breath.   Cardiovascular: Negative for chest pain.  Gastrointestinal: Positive for nausea, abdominal pain and diarrhea (chronic). Negative for vomiting, blood in stool, melena, hematochezia and hematemesis.  Genitourinary: Negative  for dysuria, urgency, frequency and hematuria.      Allergies  Sulfonamide derivatives  Home Medications   Prior to Admission medications   Medication Sig Start Date End Date Taking? Authorizing Provider  acetaminophen (TYLENOL) 500 MG tablet Take 1,500 mg by mouth every 6 (six) hours as needed for moderate pain or headache.   Yes Historical Provider, MD  azaTHIOprine (IMURAN) 50 MG tablet Take 50 mg by mouth daily.   Yes Historical Provider, MD  hydrOXYzine (ATARAX/VISTARIL) 50 MG tablet Take 50 mg by mouth 2 (two) times daily as needed for anxiety.   Yes Historical Provider, MD  Melatonin 10 MG TABS Take 10 mg by mouth at bedtime.   Yes Historical Provider, MD  omeprazole (PRILOSEC) 20 MG capsule Take 20 mg by mouth daily.   Yes Historical Provider, MD  simethicone (GAS-X) 80 MG chewable tablet Chew 80 mg by mouth every 6 (six) hours as needed for flatulence.   Yes Historical Provider, MD   BP 103/67 mmHg  Pulse 81  Temp(Src) 99.7 F (37.6 C) (Oral)  Resp 11  Ht 5\' 2"  (1.575 m)  Wt 93.895 kg  BMI 37.85 kg/m2  SpO2 96% Physical Exam  Constitutional: She is oriented to person, place, and time. She appears well-developed and well-nourished.  Non-toxic appearance. She does not have a sickly appearance. She does not appear ill.  HENT:  Head: Normocephalic and atraumatic.  Mouth/Throat: Oropharynx is clear and moist.  Eyes: Conjunctivae are normal. Pupils are equal, round, and reactive to light.  Neck: Normal range of motion. Neck supple.  Cardiovascular:  Tachycardia present.   Pulmonary/Chest: Effort normal and breath sounds normal. No accessory muscle usage or stridor. No respiratory distress. She has no wheezes. She has no rhonchi. She has no rales.  Abdominal: Soft. Bowel sounds are normal. She exhibits no distension. There is tenderness in the right upper quadrant, right lower quadrant and epigastric area. There is no rebound and no guarding.  Musculoskeletal: Normal range of  motion.  Lymphadenopathy:    She has no cervical adenopathy.  Neurological: She is alert and oriented to person, place, and time.  Speech clear without dysarthria.  Skin: Skin is warm and dry.  Psychiatric: She has a normal mood and affect. Her behavior is normal.    ED Course  Procedures (including critical care time) Labs Review Labs Reviewed  COMPREHENSIVE METABOLIC PANEL - Abnormal; Notable for the following:    Potassium 2.1 (*)    Glucose, Bld 107 (*)    Calcium 8.0 (*)    Albumin 3.1 (*)    AST 7 (*)    ALT 6 (*)    All other components within normal limits  CBC - Abnormal; Notable for the following:    Hemoglobin 11.2 (*)    HCT 33.7 (*)    All other components within normal limits  URINALYSIS, ROUTINE W REFLEX MICROSCOPIC (NOT AT [facility]) - Abnormal; Notable for the following:    APPearance CLOUDY (*)    Protein, ur 30 (*)    Leukocytes, UA SMALL (*)    All other components within normal limits  URINE MICROSCOPIC-ADD ON - Abnormal; Notable for the following:    Squamous Epithelial / LPF TOO NUMEROUS TO COUNT (*)    Bacteria, UA FEW (*)    All other components within normal limits  MAGNESIUM - Abnormal; Notable for the following:    Magnesium 1.6 (*)    All other components within normal limits  LIPASE, BLOOD  PREGNANCY, URINE    Imaging Review Ct Abdomen Pelvis W Contrast  09/30/2015  CLINICAL DATA:  Right-sided pain.  History of Crohn's disease. EXAM: CT ABDOMEN AND PELVIS WITH CONTRAST TECHNIQUE: Multidetector CT imaging of the abdomen and pelvis was performed using the standard protocol following bolus administration of intravenous contrast. CONTRAST:  ISOVUE-300 IOPAMIDOL (ISOVUE-300) INJECTION 61% COMPARISON:  09/11/2014 FINDINGS: Lower chest:  No acute findings. Hepatobiliary: No masses or other significant abnormality. Pancreas: No mass, inflammatory changes, or other significant abnormality. Spleen: Within normal limits in size and appearance.  Adrenals/Urinary Tract: No masses identified. No evidence of hydronephrosis. Stomach/Bowel: The stomach appears normal. Mild increase caliber of the proximal small bowel loops. Right lower quadrant inflammatory changes are identified. There is abnormal wall thickening and mucosal enhancement involving the distal small bowel loops. The single wall thickness measures up to 9 mm, image 50 of series 2. No evidence for bowel obstruction. No evidence for bowel perforation. No abscess noted. The mid and distal colon are normal. Vascular/Lymphatic: Calcified atherosclerotic disease involves the abdominal aorta. No aneurysm. No enlarged retroperitoneal or mesenteric adenopathy. No enlarged pelvic or inguinal lymph nodes. Reproductive: The uterus appears normal. No adnexal mass identified. Other: Aside from the right lower quadrant inflammatory changes there is no significant free fluid identified within the abdomen or pelvis. No discrete fluid collections identified to suggest abscess. Musculoskeletal:  No suspicious bone lesions identified. IMPRESSION: 1. Examination is positive for markedright lower quadrant inflammatory changes. There is a fat stranding, distal small bowel wall thickening and mucosal enhancement. In a patient who has a history  of Crohn's disease findings are compatible with active Crohn's inflammation. 2. No evidence for bowel obstruction, bowel perforation or abscess formation. Electronically Signed   By: Signa Kell M.D.   On: 09/30/2015 18:19   I have personally reviewed and evaluated these images and lab results as part of my medical decision-making.   EKG Interpretation   Date/Time:  Tuesday September 30 2015 18:17:22 EDT Ventricular Rate:  83 PR Interval:    QRS Duration: 103 QT Interval:  396 QTC Calculation: 466 R Axis:   -31 Text Interpretation:  Sinus rhythm Inferior infarct, age indeterminate  Confirmed by ZAVITZ MD, JOSHUA [phone]) on 09/30/2015 8:32:16 PM      MDM   Final  diagnoses:  Hypokalemia  Crohn's disease with complication, unspecified gastrointestinal tract location [facility])   Patient with hx of Crohn's with bowel resection 2/2 to obstruction presents with abdominal pain with N/V/D.  On exam, heart sounds normal, lungs CTAB, abdomen soft with tenderness in epigastrium, RUQ and RLQ.  No rebound, guarding, or rigidity.  Will give Diluadid, zofran, and IVF.  Will obtain labs and CT abdomen/pelvis to evaluate for obstruction or perforation.  UA appears contaminated. K 2.1; otherwise unremarkable.  Will order 2 runs IV and PO K in ED.  CT shows marked RLQ inflammatory changes with fat stranding and distal small bowel wall thickening.  No obstruction, perforation, or abscess. Will give PO prednisone and admit to medicine for IV potassium repletion, appreciate Dr. Toniann Fail.   Case has been discussed with and seen by Dr. Jodi Mourning who agrees with the above plan for admission.    Cheri Fowler, PA-C 09/30/15 2032  Blane Ohara, MD 09/30/15 [phone]

## 2015-09-30 NOTE — H&P (Signed)
History and Physical    Julie [name] RDE:[id number] DOB: July 20, 1975 DOA: 09/30/2015  PCP: PROVIDER NOT IN SYSTEM  Patient coming from: Home.  Chief Complaint: Abdominal pain.  HPI: Julie [name] is a 40 y.o. female with known history of Crohn's disease follows up with [facility] with Dr. Ubaldo Glassing, has not followed up since this February usually used to be on Remicade infusion and Imuran presents to the ER because of worsening abdominal pain over the last few days. Pain is mostly in the right lower quadrant with some nausea. Patient has chronic diarrhea with multiple episodes daily. Denies any blood in the diarrhea. Denies any fever chills or any recent use of antibiotics. On exam patient has mild right sided abdominal tenderness with CT showing features concerning for inflammatory changes in the right lower quadrant consistent with Crohn's exacerbation. Patient has been admitted for Crohn's exacerbation. In addition labs also show hypokalemia.   ED Course: She was given prednisone 60 mg along with potassium replacement.  Review of Systems: As per HPI, rest all negative.   Past Medical History  Diagnosis Date  . Acid reflux   . Crohn's colitis [facility])     Past Surgical History  Procedure Laterality Date  . Abdominal surgery       reports that she has been smoking.  She has never used smokeless tobacco. She reports that she does not drink alcohol or use illicit drugs.  Allergies  Allergen Reactions  . Sulfonamide Derivatives Nausea And Vomiting    Family History  Problem Relation Age of Onset  . Diabetes Mellitus II Mother   . Hypertension Father   . Kidney cancer Maternal Grandmother     Prior to Admission medications   Medication Sig Start Date End Date Taking? Authorizing Provider  acetaminophen (TYLENOL) 500 MG tablet Take 1,500 mg by mouth every 6 (six) hours as needed for moderate pain or headache.   Yes Historical Provider, MD  azaTHIOprine (IMURAN) 50 MG  tablet Take 50 mg by mouth daily.   Yes Historical Provider, MD  hydrOXYzine (ATARAX/VISTARIL) 50 MG tablet Take 50 mg by mouth 2 (two) times daily as needed for anxiety.   Yes Historical Provider, MD  Melatonin 10 MG TABS Take 10 mg by mouth at bedtime.   Yes Historical Provider, MD  omeprazole (PRILOSEC) 20 MG capsule Take 20 mg by mouth daily.   Yes Historical Provider, MD  simethicone (GAS-X) 80 MG chewable tablet Chew 80 mg by mouth every 6 (six) hours as needed for flatulence.   Yes Historical Provider, MD    Physical Exam: Filed Vitals:   09/30/15 1539 09/30/15 1816 09/30/15 2027 09/30/15 2105  BP: 120/67 103/67 102/54 106/62  Pulse: 118 81 87 85  Temp: 99.7 F (37.6 C)  98.8 F (37.1 C) 98.4 F (36.9 C)  TempSrc: Oral  Oral Oral  Resp: 16 11 16 16   Height: 5\' 2"  (1.575 m)   5\' 2"  (1.575 m)  Weight: 207 lb (93.895 kg)   207 lb (93.895 kg)  SpO2: 99% 96% 98% 98%      Constitutional: Not in distress. Filed Vitals:   09/30/15 1539 09/30/15 1816 09/30/15 2027 09/30/15 2105  BP: 120/67 103/67 102/54 106/62  Pulse: 118 81 87 85  Temp: 99.7 F (37.6 C)  98.8 F (37.1 C) 98.4 F (36.9 C)  TempSrc: Oral  Oral Oral  Resp: 16 11 16 16   Height: 5\' 2"  (1.575 m)   5\' 2"  (1.575 m)  Weight: 207 lb (93.895 kg)   207 lb (93.895 kg)  SpO2: 99% 96% 98% 98%   Eyes: Anicteric no pallor. ENMT: No discharge from the ears eyes nose and mouth. Neck: No mass felt. No neck rigidity. Respiratory: No rhonchi or crepitations. Cardiovascular: S1 and S2 heard. Abdomen: Right-sided tenderness. No guarding or rigidity. Musculoskeletal: No edema. Skin: No rash. Neurologic: Alert awake oriented to time place and person. Moves all extremities. Psychiatric: Appears normal.   Labs on Admission: I have personally reviewed following labs and imaging studies  CBC:  Recent Labs Lab 09/30/15 1636  WBC 10.0  HGB 11.2*  HCT 33.7*  MCV 86.6  PLT 397   Basic Metabolic Panel:  Recent  Labs Lab 09/30/15 1636  NA 136  K 2.1*  CL 101  CO2 28  GLUCOSE 107*  BUN 7  CREATININE 0.86  CALCIUM 8.0*  MG 1.6*   GFR: Estimated Creatinine Clearance: 94.7 mL/min (by C-G formula based on Cr of 0.86). Liver Function Tests:  Recent Labs Lab 09/30/15 1636  AST 7*  ALT 6*  ALKPHOS 76  BILITOT 0.3  PROT 6.8  ALBUMIN 3.1*    Recent Labs Lab 09/30/15 1636  LIPASE 32   No results for input(s): AMMONIA in the last 168 hours. Coagulation Profile: No results for input(s): INR, PROTIME in the last 168 hours. Cardiac Enzymes: No results for input(s): CKTOTAL, CKMB, CKMBINDEX, TROPONINI in the last 168 hours. BNP (last 3 results) No results for input(s): PROBNP in the last 8760 hours. HbA1C: No results for input(s): HGBA1C in the last 72 hours. CBG: No results for input(s): GLUCAP in the last 168 hours. Lipid Profile: No results for input(s): CHOL, HDL, LDLCALC, TRIG, CHOLHDL, LDLDIRECT in the last 72 hours. Thyroid Function Tests: No results for input(s): TSH, T4TOTAL, FREET4, T3FREE, THYROIDAB in the last 72 hours. Anemia Panel: No results for input(s): VITAMINB12, FOLATE, FERRITIN, TIBC, IRON, RETICCTPCT in the last 72 hours. Urine analysis:    Component Value Date/Time   COLORURINE YELLOW 09/30/2015 1600   APPEARANCEUR CLOUDY* 09/30/2015 1600   LABSPEC 1.021 09/30/2015 1600   PHURINE 6.0 09/30/2015 1600   GLUCOSEU NEGATIVE 09/30/2015 1600   HGBUR NEGATIVE 09/30/2015 1600   BILIRUBINUR NEGATIVE 09/30/2015 1600   KETONESUR NEGATIVE 09/30/2015 1600   PROTEINUR 30* 09/30/2015 1600   UROBILINOGEN 0.2 07/15/2012 1649   NITRITE NEGATIVE 09/30/2015 1600   LEUKOCYTESUR SMALL* 09/30/2015 1600   Sepsis Labs: @LABRCNTIP (procalcitonin:4,lacticidven:4) )No results found for this or any previous visit (from the past 240 hour(s)).   Radiological Exams on Admission: Ct Abdomen Pelvis W Contrast  09/30/2015  CLINICAL DATA:  Right-sided pain.  History of Crohn's  disease. EXAM: CT ABDOMEN AND PELVIS WITH CONTRAST TECHNIQUE: Multidetector CT imaging of the abdomen and pelvis was performed using the standard protocol following bolus administration of intravenous contrast. CONTRAST:  ISOVUE-300 IOPAMIDOL (ISOVUE-300) INJECTION 61% COMPARISON:  09/11/2014 FINDINGS: Lower chest:  No acute findings. Hepatobiliary: No masses or other significant abnormality. Pancreas: No mass, inflammatory changes, or other significant abnormality. Spleen: Within normal limits in size and appearance. Adrenals/Urinary Tract: No masses identified. No evidence of hydronephrosis. Stomach/Bowel: The stomach appears normal. Mild increase caliber of the proximal small bowel loops. Right lower quadrant inflammatory changes are identified. There is abnormal wall thickening and mucosal enhancement involving the distal small bowel loops. The single wall thickness measures up to 9 mm, image 50 of series 2. No evidence for bowel obstruction. No evidence for bowel perforation. No abscess noted.  The mid and distal colon are normal. Vascular/Lymphatic: Calcified atherosclerotic disease involves the abdominal aorta. No aneurysm. No enlarged retroperitoneal or mesenteric adenopathy. No enlarged pelvic or inguinal lymph nodes. Reproductive: The uterus appears normal. No adnexal mass identified. Other: Aside from the right lower quadrant inflammatory changes there is no significant free fluid identified within the abdomen or pelvis. No discrete fluid collections identified to suggest abscess. Musculoskeletal:  No suspicious bone lesions identified. IMPRESSION: 1. Examination is positive for markedright lower quadrant inflammatory changes. There is a fat stranding, distal small bowel wall thickening and mucosal enhancement. In a patient who has a history of Crohn's disease findings are compatible with active Crohn's inflammation. 2. No evidence for bowel obstruction, bowel perforation or abscess formation.  Electronically Signed   By: Signa Kell M.D.   On: 09/30/2015 18:19    Assessment/Plan Principal Problem:   Exacerbation of Crohn's disease (HCC) Active Problems:   Hypokalemia    1. Crohn's exacerbation - patient's clinical symptoms and CT scan findings are consistent with Crohn's exacerbation. Patient was given prednisone 60 mg by mouth orally and I have placed patient on 40 mg by mouth daily along with patient's Imuran. Consult gastroenterologist in a.m. Patient does not want to take IV Solu-Medrol. 2. Hypokalemia probably from diarrhea - replace and recheck. Check magnesium levels.   DVT prophylaxis: SCDs. Code Status: Full code.  Family Communication: Discussed with patient.  Disposition Plan: Home.  Consults called: None.  Admission status: Observation.    Eduard Clos MD [facility] Pager [phone].  If 7PM-7AM, please contact night-coverage [email or web address] Password [email or web address]  09/30/2015, 10:48 PM

## 2015-09-30 NOTE — Progress Notes (Signed)
PHARMACIST - PHYSICIAN ORDER COMMUNICATION  CONCERNING: P&T Medication Policy on Herbal Medications  DESCRIPTION:  This patient's order for:  melatonin  has been noted.  This product(s) is classified as an "herbal" or natural product. Due to a lack of definitive safety studies or FDA approval, nonstandard manufacturing practices, plus the potential risk of unknown drug-drug interactions while on inpatient medications, the Pharmacy and Therapeutics Committee does not permit the use of "herbal" or natural products of this type within [facility].   ACTION TAKEN: The pharmacy department is unable to verify this order at this time and your patient has been informed of this safety policy. Please reevaluate patient's clinical condition at discharge and address if the herbal or natural product(s) should be resumed at that time.   Thanks Lorenza Evangelist 09/30/2015 10:53 PM

## 2015-09-30 NOTE — Progress Notes (Signed)
Patient listed as having Medicaid [employer] insurance.  Pcp listed on patient's insurance card is located at [facility] [address] [place] [phone].  System updated.

## 2015-10-01 ENCOUNTER — Encounter (HOSPITAL_COMMUNITY): Payer: Self-pay | Admitting: Physician Assistant

## 2015-10-01 DIAGNOSIS — F17210 Nicotine dependence, cigarettes, uncomplicated: Secondary | ICD-10-CM | POA: Diagnosis present

## 2015-10-01 DIAGNOSIS — R935 Abnormal findings on diagnostic imaging of other abdominal regions, including retroperitoneum: Secondary | ICD-10-CM

## 2015-10-01 DIAGNOSIS — K909 Intestinal malabsorption, unspecified: Secondary | ICD-10-CM

## 2015-10-01 DIAGNOSIS — Z8249 Family history of ischemic heart disease and other diseases of the circulatory system: Secondary | ICD-10-CM | POA: Diagnosis not present

## 2015-10-01 DIAGNOSIS — R197 Diarrhea, unspecified: Secondary | ICD-10-CM | POA: Diagnosis not present

## 2015-10-01 DIAGNOSIS — Z79899 Other long term (current) drug therapy: Secondary | ICD-10-CM | POA: Diagnosis not present

## 2015-10-01 DIAGNOSIS — R1031 Right lower quadrant pain: Secondary | ICD-10-CM | POA: Diagnosis not present

## 2015-10-01 DIAGNOSIS — K50918 Crohn's disease, unspecified, with other complication: Secondary | ICD-10-CM | POA: Diagnosis not present

## 2015-10-01 DIAGNOSIS — R109 Unspecified abdominal pain: Secondary | ICD-10-CM | POA: Diagnosis present

## 2015-10-01 DIAGNOSIS — Z833 Family history of diabetes mellitus: Secondary | ICD-10-CM | POA: Diagnosis not present

## 2015-10-01 DIAGNOSIS — K5090 Crohn's disease, unspecified, without complications: Secondary | ICD-10-CM | POA: Diagnosis not present

## 2015-10-01 DIAGNOSIS — K5000 Crohn's disease of small intestine without complications: Secondary | ICD-10-CM | POA: Diagnosis not present

## 2015-10-01 DIAGNOSIS — K50919 Crohn's disease, unspecified, with unspecified complications: Secondary | ICD-10-CM

## 2015-10-01 DIAGNOSIS — Z8051 Family history of malignant neoplasm of kidney: Secondary | ICD-10-CM | POA: Diagnosis not present

## 2015-10-01 DIAGNOSIS — Z9049 Acquired absence of other specified parts of digestive tract: Secondary | ICD-10-CM | POA: Diagnosis not present

## 2015-10-01 DIAGNOSIS — E876 Hypokalemia: Secondary | ICD-10-CM

## 2015-10-01 DIAGNOSIS — D649 Anemia, unspecified: Secondary | ICD-10-CM | POA: Diagnosis present

## 2015-10-01 LAB — CBC
HCT: 31.4 % — ABNORMAL LOW (ref 36.0–46.0)
HEMOGLOBIN: 10.4 g/dL — AB (ref 12.0–15.0)
MCH: 29.1 pg (ref 26.0–34.0)
MCHC: 33.1 g/dL (ref 30.0–36.0)
MCV: 88 fL (ref 78.0–100.0)
Platelets: 350 10*3/uL (ref 150–400)
RBC: 3.57 MIL/uL — AB (ref 3.87–5.11)
RDW: 14.4 % (ref 11.5–15.5)
WBC: 8.3 10*3/uL (ref 4.0–10.5)

## 2015-10-01 LAB — BASIC METABOLIC PANEL
ANION GAP: 7 (ref 5–15)
BUN: 7 mg/dL (ref 6–20)
CALCIUM: 7.5 mg/dL — AB (ref 8.9–10.3)
CO2: 23 mmol/L (ref 22–32)
Chloride: 108 mmol/L (ref 101–111)
Creatinine, Ser: 0.87 mg/dL (ref 0.44–1.00)
GFR calc non Af Amer: >60 mL/min (ref >60)
Glucose, Bld: 177 mg/dL — ABNORMAL HIGH (ref 65–99)
Potassium: 3.2 mmol/L — ABNORMAL LOW (ref 3.5–5.1)
SODIUM: 138 mmol/L (ref 135–145)

## 2015-10-01 LAB — MAGNESIUM: MAGNESIUM: 1.6 mg/dL — AB (ref 1.7–2.4)

## 2015-10-01 MED ORDER — PROMETHAZINE HCL 25 MG PO TABS
25.0000 mg | ORAL_TABLET | Freq: Four times a day (QID) | ORAL | Status: DC | PRN
  Administered 2015-10-01 – 2015-10-02 (×3): 25 mg via ORAL
  Filled 2015-10-01 (×3): qty 1

## 2015-10-01 MED ORDER — PROMETHAZINE HCL 25 MG RE SUPP: 25.0000 mg | Freq: Four times a day (QID) | RECTAL | Status: DC | PRN

## 2015-10-01 MED ORDER — COLESTIPOL HCL 1 G PO TABS
2.0000 g | ORAL_TABLET | Freq: Two times a day (BID) | ORAL | Status: DC
  Administered 2015-10-01 – 2015-10-02 (×2): 2 g via ORAL
  Filled 2015-10-01 (×2): qty 2

## 2015-10-01 MED ORDER — POTASSIUM CHLORIDE CRYS ER 20 MEQ PO TBCR
20.0000 meq | EXTENDED_RELEASE_TABLET | Freq: Once | ORAL | Status: AC
  Administered 2015-10-01: 20 meq via ORAL
  Filled 2015-10-01: qty 1

## 2015-10-01 MED ORDER — ZOLPIDEM TARTRATE 5 MG PO TABS
5.0000 mg | ORAL_TABLET | Freq: Once | ORAL | Status: AC
  Administered 2015-10-01: 5 mg via ORAL
  Filled 2015-10-01: qty 1

## 2015-10-01 NOTE — Consult Note (Signed)
[facility] Consult: 1:29 PM 10/01/2015   3Dr Cena Benton  Referring Provider: Dr Cena Benton  Primary Care Physician:  Cherie Dark NP at  [facility].  Primary Gastroenterologist:  Dr. Ubaldo Glassing in [place], at [facility]   Reason for Consultation:  Abdominal pain, diarrhea.    HPI: Julie [name] is a 40 y.o. female.  Diagnosed with Crohns of SB and colon in 2014 after presenting with bowel obstruction and undergoing resection of right colon and TI.  On/off chronic right abdominal pain since then and up to 15 watery, non-bloody stools every 24 hours.   10/2014 Colonoscopy showing active colitis and anastomotic stricture.  Initiallly treated with steroid taper but on MR enterography 11/2014 had active SB disease, multiple small enteroenteric fistulas, few foci of bowel stenosis with prestenotic dilatation. So was started on Imuran 50 mg (still taking) and Remicade.  No improvement in diarrhea or in pain occurrences so stopped there Remicade in 04/2015 (thinks she had ~ 5 infusions total).  That is date of last GI OV with Dr Andrey Campanile at which time he was contemplating surgical referral.  TPMT activity normal in 11/2014, Hep B and TB testing negative 11/2014.  Diagnosed with Vitamin B12 deficiency in 11/2014 and started on monthly IM injections.  She was self administering these but eventually ran out of med.    Still has ~ 15 watery, brown stools per day.  Pain is in right abdomen and worse in last 2 to 3 weeks.  Rx with Tramadol in past was never helpful.  Paroxysms of intense pain last up to 3 minutes and subside to a tolerable 2/10 level.  2 weeks of queasiness, no vomiting or retching.  Diminished appetite and po, weight 220# to 207# over ~ 2 weeks.  Husband brought her to [facility] ED instead of taking her to [facility], where she  usually goes when ill.   Labs show K of 2.1, now up to 3.2.   CT shows significant inflammation in RLQ, fat stranding and distal SB thickening.  No obstruction, perf, abscess.    ROS: no skin sores.  + intermittent bil knee swelling and pain, no low back pain.  No vision issues.  No hx transfusions.  No syncope or weak and dizzies.  Daily OTC Omeprazole controls reflux sxs.  No family hx of IBD.    Past Medical History  Diagnosis Date  . Acid reflux   . Crohn disease (HCC) 2014    ileal and colonic dz.  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 10/2014 colonoscopy: ileal anastomotic stricture and colitis.  Remicade initiated 11/2014.  normal TPMT activity (18.3) 11/2014.  Marland Kitchen B12 deficiency 11/2014    initiated on IM B12 11/2014  . Chronic abdominal pain 2014    Past Surgical History  Procedure Laterality Date  . Colon surgery  2014    terminal ileum and right colon resection  . Colonoscopy  10/31/14    + active Crohns severe colitis and anastomotic stricture in ileum   . Esophagogastroduodenoscopy  10/2014    normal    Prior  to Admission medications   Medication Sig Start Date End Date Taking? Authorizing Provider  acetaminophen (TYLENOL) 500 MG tablet Take 1,500 mg by mouth every 6 (six) hours as needed for moderate pain or headache.   Yes Historical Provider, MD  azaTHIOprine (IMURAN) 50 MG tablet Take 50 mg by mouth daily.   Yes Historical Provider, MD  hydrOXYzine (ATARAX/VISTARIL) 50 MG tablet Take 50 mg by mouth 2 (two) times daily as needed for anxiety.   Yes Historical Provider, MD  Melatonin 10 MG TABS Take 10 mg by mouth at bedtime.   Yes Historical Provider, MD  omeprazole (PRILOSEC) 20 MG capsule Take 20 mg by mouth daily.   Yes Historical Provider, MD  simethicone (GAS-X) 80 MG chewable tablet Chew 80 mg by mouth every 6 (six) hours as needed for flatulence.   Yes Historical Provider, MD    Scheduled Meds: . azaTHIOprine  50 mg Oral Daily  . pantoprazole  40 mg Oral  Daily  . predniSONE  40 mg Oral Q breakfast   Infusions: . 0.9 % NaCl with KCl 20 mEq / L 100 mL/hr at 10/01/15 0705   PRN Meds: acetaminophen **OR** acetaminophen, hydrOXYzine, morphine injection, promethazine, promethazine, simethicone   Allergies as of 09/30/2015 - Review Complete 09/30/2015  Allergen Reaction Noted  . Sulfonamide derivatives Nausea And Vomiting 09/19/2009    Family History  Problem Relation Age of Onset  . Diabetes Mellitus II Mother   . Hypertension Father   . Kidney cancer Maternal Grandmother     Social History   Social History  . Marital Status: Single    Spouse Name: N/A  . Number of Children: N/A  . Years of Education: N/A   Occupational History  . Not on file.   Social History Main Topics  . Smoking status: Current Every Day Smoker -- 1.00 packs/day  . Smokeless tobacco: Never Used  . Alcohol Use: No  . Drug Use: No  . Sexual Activity: Yes   Other Topics Concern  . Not on file   Social History Narrative    REVIEW OF SYSTEMS: Constitutional:  Per HPI ENT:  No nose bleeds Pulm:  No SOB or cough CV:  No palpitations, no LE edema.  GU:  No hematuria, no frequency GI:  Per HPI Heme:  No unusual bleeding or bruising.    Transfusions:  None ever Neuro:  No headaches, no peripheral tingling or numbness Derm:  No itching, no rash or sores.  Endocrine:  No sweats or chills.  No polyuria or dysuria Immunization:  Not queried.   Travel:  None beyond local counties in last few months.    PHYSICAL EXAM: Vital signs in last 24 hours: Filed Vitals:   09/30/15 2105 10/01/15 0622  BP: 106/62 105/65  Pulse: 85 72  Temp: 98.4 F (36.9 C) 98.6 F (37 C)  Resp: 16 16   Wt Readings from Last 3 Encounters:  09/30/15 93.895 kg (207 lb)  09/19/09 142.429 kg (314 lb)    General: pleasant, obese.  Comfortable.  Not ill looking Head:  No swelling or asymmetry  Eyes:  No icterus or pallor Ears:  Not HOH  Nose:  No discharge Mouth:   Clear and moist oral  MM.  Several missing molars. Neck:  No mass, no JVD.  No TMG Lungs:  Clear bil.  No cough or labored breathing.  Heart: RRR.  No mrg.  S1/S2 present.   Abdomen:  Soft, ND, obese.  BS hypoactive but not  high pitched, tinkling or tympanitic.  No mass, no sores.  Well healed scars.   Rectal: deferred   Musc/Skeltl: no joint redness or gross deformity or swelling Extremities:  No CCE.    Neurologic:  Oriented x 3.  Full limb strength x 4.  No tremor.  Fully alert and awake Skin:  No rash or sores Tattoos:  On right arm Psych:  Pleasant, cooperative.   Intake/Output from previous day:   Intake/Output this shift: Total I/O In: 240 [P.O.:240] Out: -   LAB RESULTS:  Recent Labs  09/30/15 1636 10/01/15 0344  WBC 10.0 8.3  HGB 11.2* 10.4*  HCT 33.7* 31.4*  PLT 397 350   BMET Lab Results  Component Value Date   NA 138 10/01/2015   NA 136 09/30/2015   NA 136 07/15/2012   K 3.2* 10/01/2015   K 2.1* 09/30/2015   K 4.0 07/15/2012   CL 108 10/01/2015   CL 101 09/30/2015   CL 100 07/15/2012   CO2 23 10/01/2015   CO2 28 09/30/2015   CO2 27 07/15/2012   GLUCOSE 177* 10/01/2015   GLUCOSE 107* 09/30/2015   GLUCOSE 110* 07/15/2012   BUN 7 10/01/2015   BUN 7 09/30/2015   BUN 10 07/15/2012   CREATININE 0.87 10/01/2015   CREATININE 0.86 09/30/2015   CREATININE 0.65 07/15/2012   CALCIUM 7.5* 10/01/2015   CALCIUM 8.0* 09/30/2015   CALCIUM 9.0 07/15/2012   LFT  Recent Labs  09/30/15 1636  PROT 6.8  ALBUMIN 3.1*  AST 7*  ALT 6*  ALKPHOS 76  BILITOT 0.3   PT/INR No results found for: INR, PROTIME Hepatitis Panel No results for input(s): HEPBSAG, HCVAB, HEPAIGM, HEPBIGM in the last 72 hours. C-Diff No components found for: CDIFF Lipase     Component Value Date/Time   LIPASE 32 09/30/2015 1636    Drugs of Abuse  No results found for: LABOPIA, COCAINSCRNUR, LABBENZ, AMPHETMU, THCU, LABBARB   RADIOLOGY STUDIES: Ct Abdomen Pelvis W  Contrast  09/30/2015  CLINICAL DATA:  Right-sided pain.  History of Crohn's disease. EXAM: CT ABDOMEN AND PELVIS WITH CONTRAST TECHNIQUE: Multidetector CT imaging of the abdomen and pelvis was performed using the standard protocol following bolus administration of intravenous contrast. CONTRAST:  ISOVUE-300 IOPAMIDOL (ISOVUE-300) INJECTION 61% COMPARISON:  09/11/2014 FINDINGS: Lower chest:  No acute findings. Hepatobiliary: No masses or other significant abnormality. Pancreas: No mass, inflammatory changes, or other significant abnormality. Spleen: Within normal limits in size and appearance. Adrenals/Urinary Tract: No masses identified. No evidence of hydronephrosis. Stomach/Bowel: The stomach appears normal. Mild increase caliber of the proximal small bowel loops. Right lower quadrant inflammatory changes are identified. There is abnormal wall thickening and mucosal enhancement involving the distal small bowel loops. The single wall thickness measures up to 9 mm, image 50 of series 2. No evidence for bowel obstruction. No evidence for bowel perforation. No abscess noted. The mid and distal colon are normal. Vascular/Lymphatic: Calcified atherosclerotic disease involves the abdominal aorta. No aneurysm. No enlarged retroperitoneal or mesenteric adenopathy. No enlarged pelvic or inguinal lymph nodes. Reproductive: The uterus appears normal. No adnexal mass identified. Other: Aside from the right lower quadrant inflammatory changes there is no significant free fluid identified within the abdomen or pelvis. No discrete fluid collections identified to suggest abscess. Musculoskeletal:  No suspicious bone lesions identified. IMPRESSION: 1. Examination is positive for markedright lower quadrant inflammatory changes. There is a fat stranding, distal small bowel wall thickening and mucosal enhancement. In a patient who  has a history of Crohn's disease findings are compatible with active Crohn's inflammation. 2. No  evidence for bowel obstruction, bowel perforation or abscess formation. Electronically Signed   By: Signa Kell M.D.   On: 09/30/2015 18:19    ENDOSCOPIC STUDIES: Per HPI  IMPRESSION:   *  Flare vs continuation of active Crohn's in pt with hx Crohns ileocoliits.  S/p right colectomy/TI resection 2014.  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   CT today with active disease RLQ but no obstruction, abscess, perf.  Diarrhea may be bile salt related.  Oral Prednisone initiated at 40 mg daily, Imuran continued.  .    *  Hypokalemia, correcting.   *  Anemia.  Normocytic.  Hx B12 deficiency, ran out of her injectable B12 some months ago.    PLAN:     *  Per Dr Marina Goodell.  *  Added Colestid 2 gm BID.  *  Check B12 level.  Ordered.     Jennye Moccasin  10/01/2015, 1:29 PM Pager: [phone]  GI ATTENDING  History, laboratories, x-rays, prior endoscopy reports reviewed. Patient personally seen and examined. Family in room. Agree with comprehensive consultation note as outlined above. Patient with ileal Crohn status post ileocecectomy. 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 Has chronic diarrhea. Continues with the same. On Imuran but has stopped Remicade. Since with right-sided abdominal pain. No vomiting. No weight loss. No bleeding. CT scan shows inflammation in the region of the neo-ileum. Clinical picture consistent with mild Crohn's disease flare. Agree with steroids. Would give Colestid for bile salt induced diarrhea. Needs to resume B12 replacement therapy chronically. Needs to STOP SMOKING as this is terrible for Crohn's. Resume her chronic GI care with her specialist at the tertiary care center. We will check on her tomorrow.  Wilhemina Bonito. Eda Keys., M.D. [facility] Division of Gastroenterology

## 2015-10-01 NOTE — Progress Notes (Signed)
PROGRESS NOTE    Julie [name]  ZOX:[id number] DOB: [birth year] DOA: 09/30/2015 PCP: PROVIDER NOT IN SYSTEM   Brief Narrative:  40 y/o with history of Crohn's disease presenting with complaints of abdominal discomfort and nausea. Imaging results suggest Crohns flair.   Assessment & Plan:   Principal Problem:   Exacerbation of Crohn's disease (HCC) - supportive therapy - consulted GI for further evaluation and recommendations.  Active Problems:   Hypokalemia - improving - replace   DVT prophylaxis: None Code Status: full Family Communication:  None at bedside Disposition Plan: pending evaluation from GI   Consultants:   GI  Procedures:   none  Antimicrobials:  None   Subjective: Pt has no new complaints  Objective: Filed Vitals:   09/30/15 1816 09/30/15 2027 09/30/15 2105 10/01/15 0622  BP: 103/67 102/54 106/62 105/65  Pulse: 81 87 85 72  Temp:  98.8 F (37.1 C) 98.4 F (36.9 C) 98.6 F (37 C)  TempSrc:  Oral Oral Oral  Resp: 11 16 16 16   Height:   5\' 2"  (1.575 m)   Weight:   93.895 kg (207 lb)   SpO2: 96% 98% 98% 97%    Intake/Output Summary (Last 24 hours) at 10/01/15 1134 Last data filed at 10/01/15 0813  Gross per 24 hour  Intake    240 ml  Output      0 ml  Net    240 ml   Filed Weights   09/30/15 1539 09/30/15 2105  Weight: 93.895 kg (207 lb) 93.895 kg (207 lb)    Examination:  General exam: Appears calm and comfortable  Respiratory system: Clear to auscultation. Respiratory effort normal. Cardiovascular system: S1 & S2 heard, RRR. No JVD, murmurs, rubs, gallops or clicks. No pedal edema. Gastrointestinal system: Abdomen is nondistended, soft  No organomegaly or masses felt. Normal bowel sounds heard. + tenderness at right upper quadrant of abdomen. No murphys sign Central nervous system: Alert and oriented. No focal neurological deficits. Extremities: Symmetric 5 x 5 power. Skin: No rashes, lesions or ulcers Psychiatry: Judgement  and insight appear normal. Mood & affect appropriate.   Data Reviewed: I have personally reviewed following labs and imaging studies  CBC:  Recent Labs Lab 09/30/15 1636 10/01/15 0344  WBC 10.0 8.3  HGB 11.2* 10.4*  HCT 33.7* 31.4*  MCV 86.6 88.0  PLT 397 350   Basic Metabolic Panel:  Recent Labs Lab 09/30/15 1636 10/01/15 0344  NA 136 138  K 2.1* 3.2*  CL 101 108  CO2 28 23  GLUCOSE 107* 177*  BUN 7 7  CREATININE 0.86 0.87  CALCIUM 8.0* 7.5*  MG 1.6* 1.6*   GFR: Estimated Creatinine Clearance: 93.6 mL/min (by C-G formula based on Cr of 0.87). Liver Function Tests:  Recent Labs Lab 09/30/15 1636  AST 7*  ALT 6*  ALKPHOS 76  BILITOT 0.3  PROT 6.8  ALBUMIN 3.1*    Recent Labs Lab 09/30/15 1636  LIPASE 32   No results for input(s): AMMONIA in the last 168 hours. Coagulation Profile: No results for input(s): INR, PROTIME in the last 168 hours. Cardiac Enzymes: No results for input(s): CKTOTAL, CKMB, CKMBINDEX, TROPONINI in the last 168 hours. BNP (last 3 results) No results for input(s): PROBNP in the last 8760 hours. HbA1C: No results for input(s): HGBA1C in the last 72 hours. CBG: No results for input(s): GLUCAP in the last 168 hours. Lipid Profile: No results for input(s): CHOL, HDL, LDLCALC, TRIG, CHOLHDL, LDLDIRECT in the last  72 hours. Thyroid Function Tests: No results for input(s): TSH, T4TOTAL, FREET4, T3FREE, THYROIDAB in the last 72 hours. Anemia Panel: No results for input(s): VITAMINB12, FOLATE, FERRITIN, TIBC, IRON, RETICCTPCT in the last 72 hours. Sepsis Labs: No results for input(s): PROCALCITON, LATICACIDVEN in the last 168 hours.  No results found for this or any previous visit (from the past 240 hour(s)).       Radiology Studies: Ct Abdomen Pelvis W Contrast  09/30/2015  CLINICAL DATA:  Right-sided pain.  History of Crohn's disease. EXAM: CT ABDOMEN AND PELVIS WITH CONTRAST TECHNIQUE: Multidetector CT imaging of the abdomen  and pelvis was performed using the standard protocol following bolus administration of intravenous contrast. CONTRAST:  ISOVUE-300 IOPAMIDOL (ISOVUE-300) INJECTION 61% COMPARISON:  09/11/2014 FINDINGS: Lower chest:  No acute findings. Hepatobiliary: No masses or other significant abnormality. Pancreas: No mass, inflammatory changes, or other significant abnormality. Spleen: Within normal limits in size and appearance. Adrenals/Urinary Tract: No masses identified. No evidence of hydronephrosis. Stomach/Bowel: The stomach appears normal. Mild increase caliber of the proximal small bowel loops. Right lower quadrant inflammatory changes are identified. There is abnormal wall thickening and mucosal enhancement involving the distal small bowel loops. The single wall thickness measures up to 9 mm, image 50 of series 2. No evidence for bowel obstruction. No evidence for bowel perforation. No abscess noted. The mid and distal colon are normal. Vascular/Lymphatic: Calcified atherosclerotic disease involves the abdominal aorta. No aneurysm. No enlarged retroperitoneal or mesenteric adenopathy. No enlarged pelvic or inguinal lymph nodes. Reproductive: The uterus appears normal. No adnexal mass identified. Other: Aside from the right lower quadrant inflammatory changes there is no significant free fluid identified within the abdomen or pelvis. No discrete fluid collections identified to suggest abscess. Musculoskeletal:  No suspicious bone lesions identified. IMPRESSION: 1. Examination is positive for markedright lower quadrant inflammatory changes. There is a fat stranding, distal small bowel wall thickening and mucosal enhancement. In a patient who has a history of Crohn's disease findings are compatible with active Crohn's inflammation. 2. No evidence for bowel obstruction, bowel perforation or abscess formation. Electronically Signed   By: Signa Kell M.D.   On: 09/30/2015 18:19        Scheduled Meds: .  azaTHIOprine  50 mg Oral Daily  . pantoprazole  40 mg Oral Daily  . predniSONE  40 mg Oral Q breakfast   Continuous Infusions: . 0.9 % NaCl with KCl 20 mEq / L 100 mL/hr at 10/01/15 0705   Time spent: > 35  Penny Pia, MD [facility] Pager [phone]  If 7PM-7AM, please contact night-coverage [email or web address] Password [email or web address] 10/01/2015, 11:34 AM

## 2015-10-02 DIAGNOSIS — K5090 Crohn's disease, unspecified, without complications: Secondary | ICD-10-CM

## 2015-10-02 LAB — BASIC METABOLIC PANEL
ANION GAP: 3 — AB (ref 5–15)
BUN: 8 mg/dL (ref 6–20)
CALCIUM: 7.8 mg/dL — AB (ref 8.9–10.3)
CHLORIDE: 114 mmol/L — AB (ref 101–111)
CO2: 25 mmol/L (ref 22–32)
Creatinine, Ser: 0.58 mg/dL (ref 0.44–1.00)
GFR calc non Af Amer: >60 mL/min (ref >60)
Glucose, Bld: 103 mg/dL — ABNORMAL HIGH (ref 65–99)
Potassium: 3 mmol/L — ABNORMAL LOW (ref 3.5–5.1)
Sodium: 142 mmol/L (ref 135–145)

## 2015-10-02 LAB — VITAMIN B12: Vitamin B-12: 132 pg/mL — ABNORMAL LOW (ref 180–914)

## 2015-10-02 MED ORDER — PROMETHAZINE HCL 25 MG PO TABS: 25.0000 mg | ORAL_TABLET | Freq: Four times a day (QID) | ORAL | Status: DC | PRN

## 2015-10-02 MED ORDER — POTASSIUM CHLORIDE CRYS ER 20 MEQ PO TBCR
40.0000 meq | EXTENDED_RELEASE_TABLET | Freq: Once | ORAL | Status: AC
  Administered 2015-10-02: 40 meq via ORAL
  Filled 2015-10-02: qty 2

## 2015-10-02 MED ORDER — PREDNISONE 20 MG PO TABS: 40.0000 mg | ORAL_TABLET | Freq: Every day | ORAL | Status: DC

## 2015-10-02 MED ORDER — CYANOCOBALAMIN 1000 MCG/ML IJ SOLN
1000.0000 ug | Freq: Once | INTRAMUSCULAR | Status: AC
  Administered 2015-10-02: 1000 ug via INTRAMUSCULAR
  Filled 2015-10-02: qty 1

## 2015-10-02 MED ORDER — COLESTIPOL HCL 1 G PO TABS: 2.0000 g | ORAL_TABLET | Freq: Two times a day (BID) | ORAL | Status: DC

## 2015-10-02 NOTE — Progress Notes (Signed)
Pt discharged home in stable condition. Discharge instructions and scripts given. Pt verbalized understanding. Pt with no immediate questions/concerns at this time

## 2015-10-02 NOTE — Discharge Summary (Signed)
Physician Discharge Summary  Julie [name] WKG:[id number] DOB: June 28, 1975 DOA: 09/30/2015  PCP: PROVIDER NOT IN SYSTEM  Admit date: 09/30/2015 Discharge date: 10/02/2015  Time spent: > 35 minutes  Recommendations for Outpatient Follow-up:  1. Monitor potassium levels   Discharge Diagnoses:  Principal Problem:   Exacerbation of Crohn's disease (HCC) Active Problems:   Hypokalemia   Discharge Condition: stable  Diet recommendation: regular diet  Filed Weights   09/30/15 1539 09/30/15 2105  Weight: 93.895 kg (207 lb) 93.895 kg (207 lb)    History of present illness:  Patient is a 40 year old with history of Crohn's disease that presented with mild Crohn's flare  Hospital Course:  Mild Crohns flair - GI consulted who recommended the following: Discharge on Prednisone 40 mg po QAM x 2 weeks then taper by 5 mg per week after that until see Dr Andrey Campanile Please give Rx for phenergan for nausea(Zofrn doesn't work) Resume Imuran  Give rx for [employer] 2gm once or twice daily away from other meds  Procedures:  None  Consultations:  Gastroenterology: Dr. Marina Goodell  Discharge Exam: Filed Vitals:   10/01/15 2131 10/02/15 0637  BP: 128/75 113/69  Pulse: 61 69  Temp: 98.2 F (36.8 C) 98.4 F (36.9 C)  Resp: 16 16    General: Pt in nad, alert and awake Cardiovascular: rrr, no rubs  Respiratory: no increased wob, no wheezes  Discharge Instructions   Discharge Instructions    Call MD for:  severe uncontrolled pain    Complete by:  As directed      Call MD for:  temperature >100.4    Complete by:  As directed      Diet - low sodium heart healthy    Complete by:  As directed      Discharge instructions    Complete by:  As directed   Discharge on Prednisone 40 mg po QAM x 2 weeks then taper by 5 mg per week after that until see Dr Andrey Campanile Please give Rx for phenergan for nausea(Zofrn doesn't work) Resume Imuran  Give rx for [employer] 2gm once or twice daily away from other  meds     Increase activity slowly    Complete by:  As directed           Current Discharge Medication List    START taking these medications   Details  colestipol (COLESTID) 1 g tablet Take 2 tablets (2 g total) by mouth 2 (two) times daily. Qty: 14 tablet, Refills: 0    predniSONE (DELTASONE) 20 MG tablet Take 2 tablets (40 mg total) by mouth daily with breakfast. Qty: 45 tablet, Refills: 0    promethazine (PHENERGAN) 25 MG tablet Take 1 tablet (25 mg total) by mouth every 6 (six) hours as needed for nausea or vomiting. Qty: 30 tablet, Refills: 0      CONTINUE these medications which have NOT CHANGED   Details  acetaminophen (TYLENOL) 500 MG tablet Take 1,500 mg by mouth every 6 (six) hours as needed for moderate pain or headache.    azaTHIOprine (IMURAN) 50 MG tablet Take 50 mg by mouth daily.    hydrOXYzine (ATARAX/VISTARIL) 50 MG tablet Take 50 mg by mouth 2 (two) times daily as needed for anxiety.    Melatonin 10 MG TABS Take 10 mg by mouth at bedtime.    omeprazole (PRILOSEC) 20 MG capsule Take 20 mg by mouth daily.    simethicone (GAS-X) 80 MG chewable tablet Chew 80 mg by mouth every 6 (  six) hours as needed for flatulence.       Allergies  Allergen Reactions  . Sulfonamide Derivatives Nausea And Vomiting      The results of significant diagnostics from this hospitalization (including imaging, microbiology, ancillary and laboratory) are listed below for reference.    Significant Diagnostic Studies: Ct Abdomen Pelvis W Contrast  09/30/2015  CLINICAL DATA:  Right-sided pain.  History of Crohn's disease. EXAM: CT ABDOMEN AND PELVIS WITH CONTRAST TECHNIQUE: Multidetector CT imaging of the abdomen and pelvis was performed using the standard protocol following bolus administration of intravenous contrast. CONTRAST:  ISOVUE-300 IOPAMIDOL (ISOVUE-300) INJECTION 61% COMPARISON:  09/11/2014 FINDINGS: Lower chest:  No acute findings. Hepatobiliary: No masses or other  significant abnormality. Pancreas: No mass, inflammatory changes, or other significant abnormality. Spleen: Within normal limits in size and appearance. Adrenals/Urinary Tract: No masses identified. No evidence of hydronephrosis. Stomach/Bowel: The stomach appears normal. Mild increase caliber of the proximal small bowel loops. Right lower quadrant inflammatory changes are identified. There is abnormal wall thickening and mucosal enhancement involving the distal small bowel loops. The single wall thickness measures up to 9 mm, image 50 of series 2. No evidence for bowel obstruction. No evidence for bowel perforation. No abscess noted. The mid and distal colon are normal. Vascular/Lymphatic: Calcified atherosclerotic disease involves the abdominal aorta. No aneurysm. No enlarged retroperitoneal or mesenteric adenopathy. No enlarged pelvic or inguinal lymph nodes. Reproductive: The uterus appears normal. No adnexal mass identified. Other: Aside from the right lower quadrant inflammatory changes there is no significant free fluid identified within the abdomen or pelvis. No discrete fluid collections identified to suggest abscess. Musculoskeletal:  No suspicious bone lesions identified. IMPRESSION: 1. Examination is positive for markedright lower quadrant inflammatory changes. There is a fat stranding, distal small bowel wall thickening and mucosal enhancement. In a patient who has a history of Crohn's disease findings are compatible with active Crohn's inflammation. 2. No evidence for bowel obstruction, bowel perforation or abscess formation. Electronically Signed   By: Signa Kell M.D.   On: 09/30/2015 18:19    Microbiology: No results found for this or any previous visit (from the past 240 hour(s)).   Labs: Basic Metabolic Panel:  Recent Labs Lab 09/30/15 1636 10/01/15 0344 10/02/15 0336  NA 136 138 142  K 2.1* 3.2* 3.0*  CL 101 108 114*  CO2 GLUCOSE 107* 177* 103*  BUN CREATININE 0.86 0.87 0.58  CALCIUM 8.0* 7.5* 7.8*  MG 1.6* 1.6*  --    Liver Function Tests:  Recent Labs Lab 09/30/15 1636  AST 7*  ALT 6*  ALKPHOS 76  BILITOT 0.3  PROT 6.8  ALBUMIN 3.1*    Recent Labs Lab 09/30/15 1636  LIPASE 32   No results for input(s): AMMONIA in the last 168 hours. CBC:  Recent Labs Lab 09/30/15 1636 10/01/15 0344  WBC 10.0 8.3  HGB 11.2* 10.4*  HCT 33.7* 31.4*  MCV 86.6 88.0  PLT 397 350   Cardiac Enzymes: No results for input(s): CKTOTAL, CKMB, CKMBINDEX, TROPONINI in the last 168 hours. BNP: BNP (last 3 results) No results for input(s): BNP in the last 8760 hours.  ProBNP (last 3 results) No results for input(s): PROBNP in the last 8760 hours.  CBG: No results for input(s): GLUCAP in the last 168 hours.   Signed:  Penny Pia MD.  [facility] 10/02/2015, 1:27 PM

## 2015-10-02 NOTE — Progress Notes (Signed)
Progress Note   Subjective  Wants to go home - feels better- but still has nausea and abdominal cramping   Objective   Vital signs in last 24 hours: Temp:  [98.2 F (36.8 C)-98.4 F (36.9 C)] 98.4 F (36.9 C) (07/06 0637) Pulse Rate:  [56-69] 69 (07/06 0637) Resp:  [16] 16 (07/06 0637) BP: (113-128)/(69-75) 113/69 mmHg (07/06 0637) SpO2:  [98 %-100 %] 98 % (07/06 0637) Last BM Date: 10/02/15 General:    white female in NAD Heart:  Regular rate and rhythm; no murmurs Lungs: Respirations even and unlabored, lungs CTA bilaterally Abdomen:  Soft, tender lower abdomen  and nondistended. Normal bowel sounds. Extremities:  Without edema. Neurologic:  Alert and oriented,  grossly normal neurologically. Psych:  Cooperative. Normal mood and affect.  Intake/Output from previous day: 07/05 0701 - 07/06 0700 In: 840 [P.O.:840] Out: -  Intake/Output this shift:    Lab Results:  Recent Labs  09/30/15 1636 10/01/15 0344  WBC 10.0 8.3  HGB 11.2* 10.4*  HCT 33.7* 31.4*  PLT 397 350   BMET  Recent Labs  09/30/15 1636 10/01/15 0344 10/02/15 0336  NA 136 138 142  K 2.1* 3.2* 3.0*  CL 101 108 114*  CO2 28 23 25   GLUCOSE 107* 177* 103*  BUN 7 7 8   CREATININE 0.86 0.87 0.58  CALCIUM 8.0* 7.5* 7.8*   LFT  Recent Labs  09/30/15 1636  PROT 6.8  ALBUMIN 3.1*  AST 7*  ALT 6*  ALKPHOS 76  BILITOT 0.3   PT/INR No results for input(s): LABPROT, INR in the last 72 hours.  Studies/Results: Ct Abdomen Pelvis W Contrast  09/30/2015  CLINICAL DATA:  Right-sided pain.  History of Crohn's disease. EXAM: CT ABDOMEN AND PELVIS WITH CONTRAST TECHNIQUE: Multidetector CT imaging of the abdomen and pelvis was performed using the standard protocol following bolus administration of intravenous contrast. CONTRAST:  ISOVUE-300 IOPAMIDOL (ISOVUE-300) INJECTION 61% COMPARISON:  09/11/2014 FINDINGS: Lower chest:  No acute findings. Hepatobiliary: No masses or other significant  abnormality. Pancreas: No mass, inflammatory changes, or other significant abnormality. Spleen: Within normal limits in size and appearance. Adrenals/Urinary Tract: No masses identified. No evidence of hydronephrosis. Stomach/Bowel: The stomach appears normal. Mild increase caliber of the proximal small bowel loops. Right lower quadrant inflammatory changes are identified. There is abnormal wall thickening and mucosal enhancement involving the distal small bowel loops. The single wall thickness measures up to 9 mm, image 50 of series 2. No evidence for bowel obstruction. No evidence for bowel perforation. No abscess noted. The mid and distal colon are normal. Vascular/Lymphatic: Calcified atherosclerotic disease involves the abdominal aorta. No aneurysm. No enlarged retroperitoneal or mesenteric adenopathy. No enlarged pelvic or inguinal lymph nodes. Reproductive: The uterus appears normal. No adnexal mass identified. Other: Aside from the right lower quadrant inflammatory changes there is no significant free fluid identified within the abdomen or pelvis. No discrete fluid collections identified to suggest abscess. Musculoskeletal:  No suspicious bone lesions identified. IMPRESSION: 1. Examination is positive for markedright lower quadrant inflammatory changes. There is a fat stranding, distal small bowel wall thickening and mucosal enhancement. In a patient who has a history of Crohn's disease findings are compatible with active Crohn's inflammation. 2. No evidence for bowel obstruction, bowel perforation or abscess formation. Electronically Signed   By: Signa Kell M.D.   On: 09/30/2015 18:19       Assessment / Plan:    #1 40 yo female with known Crohns  ileocolitis s/p ileocecectomy  With chronic diarrhea, known anastomotic stricture  Admitted with exacerbation of pain ,nausea and active inflammation noted at neo-ileum on CT   Pt wants to be discharged -able to eat She is considering switching  her  care to [facility]- I encouraged her to continue care with dr Andrey Campanile at [facility] at this point with her complicated disease She stopped Remicade herself due to lack of response - had been on since 11/16-was on Humira prior to that (again didn't  Help)  Discharge on Prednisone 40 mg po QAM x 2 weeks then taper by 5 mg per week after that until see Dr Andrey Campanile Please give Rx for phenergan for nausea(Zofrn doesn't work) Resume Imuran   Give rx for [employer] 2gm once or twice daily away from other meds Resume B12 replacement chronically per Dr Andrey Campanile  Stop smoking Very important she get appt with Dr Andrey Campanile within next few weeks  Principal Problem:   Exacerbation of Crohn's disease (HCC) Active Problems:   Hypokalemia     LOS: 1 day   Amy Esterwood  10/02/2015, 10:23 AM  GI ATTENDING  Interval history data reviewed. Agree with interval progress note as outlined. Agree with recommendations as outlined. Okay for discharge. She should resume care with her gastroenterologist at [facility]. Will sign off  Wilhemina Bonito. Eda Keys., M.D. [facility] Division of Gastroenterology

## 2015-11-13 ENCOUNTER — Encounter (HOSPITAL_COMMUNITY): Payer: Self-pay | Admitting: *Deleted

## 2015-11-13 ENCOUNTER — Emergency Department (HOSPITAL_COMMUNITY)
Admission: EM | Admit: 2015-11-13 | Discharge: 2015-11-13 | Disposition: A | Discharge status: Home / Self Care | Payer: Medicaid Other | Attending: Emergency Medicine | Admitting: Emergency Medicine

## 2015-11-13 DIAGNOSIS — F17200 Nicotine dependence, unspecified, uncomplicated: Secondary | ICD-10-CM | POA: Insufficient documentation

## 2015-11-13 DIAGNOSIS — R109 Unspecified abdominal pain: Secondary | ICD-10-CM | POA: Insufficient documentation

## 2015-11-13 DIAGNOSIS — Z5321 Procedure and treatment not carried out due to patient leaving prior to being seen by health care provider: Secondary | ICD-10-CM | POA: Diagnosis not present

## 2015-11-13 LAB — URINALYSIS, ROUTINE W REFLEX MICROSCOPIC
Glucose, UA: NEGATIVE mg/dL
KETONES UR: 15 mg/dL — AB
Nitrite: NEGATIVE
PROTEIN: 30 mg/dL — AB
Specific Gravity, Urine: 1.023 (ref 1.005–1.030)
pH: 6 (ref 5.0–8.0)

## 2015-11-13 LAB — I-STAT BETA HCG BLOOD, ED (MC, WL, AP ONLY)

## 2015-11-13 LAB — COMPREHENSIVE METABOLIC PANEL
ALK PHOS: 83 U/L (ref 38–126)
ALT: 6 U/L — AB (ref 14–54)
AST: 8 U/L — ABNORMAL LOW (ref 15–41)
Albumin: 2.6 g/dL — ABNORMAL LOW (ref 3.5–5.0)
Anion gap: 6 (ref 5–15)
CALCIUM: 8.3 mg/dL — AB (ref 8.9–10.3)
CO2: 28 mmol/L (ref 22–32)
CREATININE: 0.7 mg/dL (ref 0.44–1.00)
Chloride: 104 mmol/L (ref 101–111)
Glucose, Bld: 99 mg/dL (ref 65–99)
Potassium: 2.5 mmol/L — CL (ref 3.5–5.1)
Sodium: 138 mmol/L (ref 135–145)
Total Bilirubin: 0.3 mg/dL (ref 0.3–1.2)
Total Protein: 6.4 g/dL — ABNORMAL LOW (ref 6.5–8.1)

## 2015-11-13 LAB — URINE MICROSCOPIC-ADD ON

## 2015-11-13 LAB — CBC
HCT: 36.1 % (ref 36.0–46.0)
Hemoglobin: 11.6 g/dL — ABNORMAL LOW (ref 12.0–15.0)
MCH: 28.3 pg (ref 26.0–34.0)
MCHC: 32.1 g/dL (ref 30.0–36.0)
MCV: 88 fL (ref 78.0–100.0)
PLATELETS: 323 10*3/uL (ref 150–400)
RBC: 4.1 MIL/uL (ref 3.87–5.11)
RDW: 14.5 % (ref 11.5–15.5)
WBC: 9.5 10*3/uL (ref 4.0–10.5)

## 2015-11-13 LAB — LIPASE, BLOOD: Lipase: 27 U/L (ref 11–51)

## 2015-11-13 NOTE — ED Triage Notes (Signed)
Pt reports right side abd pain for several days. Having n/v, chronic diarrhea. Hx of crohns.

## 2015-11-13 NOTE — ED Notes (Signed)
Pt called for room assignment with no answer 

## 2015-11-13 NOTE — ED Notes (Signed)
Pt did not answer, called 3x in lobby. This RN also attempted to contact pt via phone number in chart x 2 w/ no response and voicemail full. Also attempted to contact emergency contact w/ no response.

## 2015-11-16 ENCOUNTER — Emergency Department (HOSPITAL_COMMUNITY)
Admission: EM | Admit: 2015-11-16 | Discharge: 2015-11-16 | Disposition: A | Discharge status: Home / Self Care | Payer: Medicaid Other | Attending: Emergency Medicine | Admitting: Emergency Medicine

## 2015-11-16 ENCOUNTER — Encounter (HOSPITAL_COMMUNITY): Payer: Self-pay | Admitting: Emergency Medicine

## 2015-11-16 DIAGNOSIS — R1033 Periumbilical pain: Secondary | ICD-10-CM | POA: Diagnosis not present

## 2015-11-16 DIAGNOSIS — R1013 Epigastric pain: Secondary | ICD-10-CM | POA: Insufficient documentation

## 2015-11-16 DIAGNOSIS — G8929 Other chronic pain: Secondary | ICD-10-CM | POA: Insufficient documentation

## 2015-11-16 DIAGNOSIS — R1031 Right lower quadrant pain: Secondary | ICD-10-CM | POA: Diagnosis not present

## 2015-11-16 DIAGNOSIS — F17200 Nicotine dependence, unspecified, uncomplicated: Secondary | ICD-10-CM | POA: Diagnosis not present

## 2015-11-16 DIAGNOSIS — Z79899 Other long term (current) drug therapy: Secondary | ICD-10-CM | POA: Insufficient documentation

## 2015-11-16 DIAGNOSIS — E876 Hypokalemia: Secondary | ICD-10-CM | POA: Insufficient documentation

## 2015-11-16 DIAGNOSIS — R109 Unspecified abdominal pain: Secondary | ICD-10-CM

## 2015-11-16 LAB — URINALYSIS, ROUTINE W REFLEX MICROSCOPIC
BILIRUBIN URINE: NEGATIVE
GLUCOSE, UA: NEGATIVE mg/dL
KETONES UR: NEGATIVE mg/dL
NITRITE: NEGATIVE
PH: 6 (ref 5.0–8.0)
PROTEIN: 30 mg/dL — AB
Specific Gravity, Urine: 1.025 (ref 1.005–1.030)

## 2015-11-16 LAB — COMPREHENSIVE METABOLIC PANEL
ALBUMIN: 3 g/dL — AB (ref 3.5–5.0)
ALK PHOS: 82 U/L (ref 38–126)
AST: 10 U/L — AB (ref 15–41)
Anion gap: 9 (ref 5–15)
BILIRUBIN TOTAL: 0.3 mg/dL (ref 0.3–1.2)
BUN: 5 mg/dL — AB (ref 6–20)
CO2: 27 mmol/L (ref 22–32)
CREATININE: 0.77 mg/dL (ref 0.44–1.00)
Calcium: 8.6 mg/dL — ABNORMAL LOW (ref 8.9–10.3)
Chloride: 101 mmol/L (ref 101–111)
GFR calc Af Amer: >60 mL/min (ref >60)
GLUCOSE: 113 mg/dL — AB (ref 65–99)
Potassium: 2.3 mmol/L — CL (ref 3.5–5.1)
Sodium: 137 mmol/L (ref 135–145)
TOTAL PROTEIN: 6.6 g/dL (ref 6.5–8.1)

## 2015-11-16 LAB — I-STAT CHEM 8, ED
CALCIUM ION: 1.09 mmol/L — AB (ref 1.13–1.30)
CHLORIDE: 100 mmol/L — AB (ref 101–111)
Creatinine, Ser: 0.7 mg/dL (ref 0.44–1.00)
Glucose, Bld: 123 mg/dL — ABNORMAL HIGH (ref 65–99)
HEMATOCRIT: 29 % — AB (ref 36.0–46.0)
Hemoglobin: 9.9 g/dL — ABNORMAL LOW (ref 12.0–15.0)
POTASSIUM: 2.6 mmol/L — AB (ref 3.5–5.1)
SODIUM: 141 mmol/L (ref 135–145)
TCO2: 26 mmol/L (ref 0–100)

## 2015-11-16 LAB — CBC
HCT: 35.2 % — ABNORMAL LOW (ref 36.0–46.0)
Hemoglobin: 11.8 g/dL — ABNORMAL LOW (ref 12.0–15.0)
MCH: 29 pg (ref 26.0–34.0)
MCHC: 33.5 g/dL (ref 30.0–36.0)
MCV: 86.5 fL (ref 78.0–100.0)
PLATELETS: 415 10*3/uL — AB (ref 150–400)
RBC: 4.07 MIL/uL (ref 3.87–5.11)
RDW: 14.7 % (ref 11.5–15.5)
WBC: 8.8 10*3/uL (ref 4.0–10.5)

## 2015-11-16 LAB — URINE MICROSCOPIC-ADD ON

## 2015-11-16 LAB — LIPASE, BLOOD: LIPASE: 30 U/L (ref 11–51)

## 2015-11-16 LAB — MAGNESIUM: Magnesium: 1.7 mg/dL (ref 1.7–2.4)

## 2015-11-16 MED ORDER — HYDROCODONE-ACETAMINOPHEN 5-325 MG PO TABS
1.0000 | ORAL_TABLET | Freq: Once | ORAL | Status: AC
  Administered 2015-11-16: 1 via ORAL
  Filled 2015-11-16: qty 1

## 2015-11-16 MED ORDER — POTASSIUM CHLORIDE CRYS ER 20 MEQ PO TBCR
40.0000 meq | EXTENDED_RELEASE_TABLET | Freq: Once | ORAL | Status: AC
  Administered 2015-11-16: 40 meq via ORAL
  Filled 2015-11-16: qty 2

## 2015-11-16 MED ORDER — PREDNISONE 20 MG PO TABS: 40.0000 mg | ORAL_TABLET | Freq: Every day | ORAL | 0 refills | Status: DC

## 2015-11-16 MED ORDER — MAGNESIUM SULFATE 50 % IJ SOLN: 1.0000 g | Freq: Once | INTRAMUSCULAR | Status: DC

## 2015-11-16 MED ORDER — OXYCODONE-ACETAMINOPHEN 5-325 MG PO TABS
1.0000 | ORAL_TABLET | Freq: Four times a day (QID) | ORAL | 0 refills | Status: DC | PRN
Start: 2015-11-16 — End: 2016-07-16

## 2015-11-16 MED ORDER — ONDANSETRON HCL 4 MG/2ML IJ SOLN
4.0000 mg | Freq: Once | INTRAMUSCULAR | Status: AC
  Administered 2015-11-16: 4 mg via INTRAVENOUS
  Filled 2015-11-16: qty 2

## 2015-11-16 MED ORDER — POTASSIUM CHLORIDE 10 MEQ/100ML IV SOLN
10.0000 meq | INTRAVENOUS | Status: AC
Start: 2015-11-16 — End: 2015-11-16
  Administered 2015-11-16 (×3): 10 meq via INTRAVENOUS
  Filled 2015-11-16 (×3): qty 100

## 2015-11-16 MED ORDER — MAGNESIUM SULFATE IN D5W 1-5 GM/100ML-% IV SOLN
1.0000 g | Freq: Once | INTRAVENOUS | Status: AC
  Administered 2015-11-16: 1 g via INTRAVENOUS
  Filled 2015-11-16: qty 100

## 2015-11-16 MED ORDER — SODIUM CHLORIDE 0.9 % IV BOLUS (SEPSIS)
500.0000 mL | Freq: Once | INTRAVENOUS | Status: AC
  Administered 2015-11-16: 500 mL via INTRAVENOUS

## 2015-11-16 MED ORDER — POTASSIUM CHLORIDE ER 20 MEQ PO TBCR: 40.0000 meq | EXTENDED_RELEASE_TABLET | Freq: Every day | ORAL | 0 refills | Status: DC

## 2015-11-16 MED ORDER — ONDANSETRON 4 MG PO TBDP: 4.0000 mg | ORAL_TABLET | Freq: Three times a day (TID) | ORAL | 0 refills | Status: DC | PRN

## 2015-11-16 MED ORDER — MORPHINE SULFATE (PF) 2 MG/ML IV SOLN
2.0000 mg | Freq: Once | INTRAVENOUS | Status: AC
  Administered 2015-11-16: 2 mg via INTRAVENOUS
  Filled 2015-11-16: qty 1

## 2015-11-16 MED ORDER — MAGNESIUM SULFATE IN D5W 1-5 GM/100ML-% IV SOLN
1.0000 g | Freq: Once | INTRAVENOUS | Status: DC
  Filled 2015-11-16: qty 100

## 2015-11-16 NOTE — ED Notes (Signed)
Patient requested pain medication. PA notified.

## 2015-11-16 NOTE — ED Notes (Signed)
PA made aware of patient's potassium

## 2015-11-16 NOTE — ED Triage Notes (Signed)
Patient c/o right sided abd pain over the past several days. Patient states pain is sharp. Pt has PMH of Crohns and had diarrhea x 4 years.

## 2015-11-16 NOTE — ED Notes (Signed)
Julie [name] Tech notified med of Potassium 2.6

## 2015-11-16 NOTE — ED Notes (Signed)
P.A and RN notified of patient's K of 2.5

## 2015-11-16 NOTE — Discharge Instructions (Signed)
Read the information below.   Your potassium was low. You were given oral and IV potassium.  Your labs are re-assuring, with your history of right sided abdominal pain and recent CT in July showing a flare up of your Crohn's, I suspect you have may a crohn's flare up. We discussed holding off imaging your abdomen at this time and will treat for a flare up of Crohn's.  I have prescribed zofran for nausea, percocet for severe pain, and prednisone.  It is important that you be seen by your GI doctor for re-evaluation, please call to schedule a follow up.  It is also important that you are seen by your primary care doctor within the next 3 days for a re-check of your potassium.  Use the prescribed medication as directed.  Please discuss all new medications with your pharmacist.   You may return to the Emergency Department at any time for worsening condition or any new symptoms that concern you. Return to ED if you have new or worsening symptoms or you develop fever, inability to control pain at home, abdominal pain that is new or different, blood in stool, or inability to keep fluids down.

## 2015-11-16 NOTE — ED Notes (Signed)
PA at bedside.

## 2015-11-18 NOTE — ED Provider Notes (Signed)
[facility]-EMERGENCY DEPT Provider Note   CSN: [id number] Arrival date & time: 11/16/15  1511     History   Chief Complaint Chief Complaint  Patient presents with  . Abdominal Pain    HPI Julie [name] is a 40 y.o. female.  Julie [name] is a 40 y.o. female with history of crohn's s/p partial colonic resection, obesity, acid reflux, chronic abdominal pain presents to ED with complaint of right sided abdominal pain. Patient states pain started approximately one week ago. Pain is constant and sharp in nature. She has associated chronic nausea, positional lightheadedness, diarrhea x 4 years, and intermittent vomiting (x 1 day this past week). Patient has tried tylenol, ibuprofen, and heating pad with minimal relief. Denies dysuria, hematuria, vaginal pain, vaginal bleeding, vaginal discharge, fever, chest pain, shortness of breath. Patient is s/p cholecystectomy and appendectomy. Patient is scheduled to see her GI specialist in November; however, is requesting a local GI provider. Patient is not currently on immunosuppressive agents. She was hospitalized in Jul 2017 for hypokalemia and crohn's flare, she was placed on steroids at that time.    The history is provided by the patient and medical records.    Past Medical History:  Diagnosis Date  . Acid reflux   . B12 deficiency 11/2014   initiated on IM B12 11/2014  . Chronic abdominal pain 2014  . Crohn disease (HCC) 2014   ileal and colonic dz.  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 10/2014 colonoscopy: ileal anastomotic stricture and colitis.  Remicade initiated 11/2014.  normal TPMT activity (18.3) 11/2014.  . Obesity 2011   314# in 2011    Patient Active Problem List   Diagnosis Date Noted  . Hypokalemia 09/30/2015  . Exacerbation of Crohn's disease (HCC) 09/30/2015  . SHORTNESS OF BREATH 09/19/2009  . CHEST PAIN UNSPECIFIED 09/19/2009    Past Surgical History:  Procedure Laterality Date  . CESAREAN SECTION  2003   vaginal  delivery 2000  . COLON SURGERY  2014   terminal ileum and right colon resection  . COLONOSCOPY  10/31/14   + active Crohns severe colitis and anastomotic stricture in ileum   . ESOPHAGOGASTRODUODENOSCOPY  10/2014   normal  . LAPAROSCOPIC CHOLECYSTECTOMY  2010    OB History    No data available       Home Medications    Prior to Admission medications   Medication Sig Start Date End Date Taking? Authorizing Provider  acetaminophen (TYLENOL) 500 MG tablet Take 1,500 mg by mouth every 6 (six) hours as needed for moderate pain or headache.   Yes Historical Provider, MD  azaTHIOprine (IMURAN) 50 MG tablet Take 50 mg by mouth daily.   Yes Historical Provider, MD  hydrOXYzine (ATARAX/VISTARIL) 50 MG tablet Take 50 mg by mouth 2 (two) times daily as needed for anxiety.   Yes Historical Provider, MD  Melatonin 10 MG TABS Take 10 mg by mouth at bedtime.   Yes Historical Provider, MD  omeprazole (PRILOSEC) 20 MG capsule Take 20 mg by mouth daily.   Yes Historical Provider, MD  ondansetron (ZOFRAN ODT) 4 MG disintegrating tablet Take 1 tablet (4 mg total) by mouth every 8 (eight) hours as needed for nausea or vomiting. 11/16/15   Lona Kettle, PA-C  oxyCODONE-acetaminophen (PERCOCET/ROXICET) 5-325 MG tablet Take 1 tablet by mouth every 6 (six) hours as needed for severe pain. 11/16/15   Lona Kettle, PA-C  potassium chloride 20 MEQ TBCR Take 40 mEq by mouth daily. 11/16/15  Lona Kettle, PA-C  predniSONE (DELTASONE) 20 MG tablet Take 2 tablets (40 mg total) by mouth daily. 11/16/15   Lona Kettle, PA-C  simethicone (GAS-X) 80 MG chewable tablet Chew 80 mg by mouth every 6 (six) hours as needed for flatulence.    Historical Provider, MD    Family History Family History  Problem Relation Age of Onset  . Diabetes Mellitus II Mother   . Hypertension Father   . Kidney cancer Maternal Grandmother     Social History Social History  Substance Use Topics  . Smoking status:  Current Every Day Smoker    Packs/day: 1.00  . Smokeless tobacco: Never Used  . Alcohol use No     Allergies   Sulfonamide derivatives   Review of Systems Review of Systems  Constitutional: Negative for chills, diaphoresis and fever.  HENT: Negative for trouble swallowing.   Eyes: Negative for visual disturbance.  Respiratory: Negative for shortness of breath.   Cardiovascular: Negative for chest pain.  Gastrointestinal: Positive for abdominal pain, diarrhea ( chronic), nausea ( chronic) and vomiting (intermittent/sporadic). Negative for constipation.  Genitourinary: Negative for dysuria, hematuria, pelvic pain, vaginal bleeding, vaginal discharge and vaginal pain.  Musculoskeletal: Negative for neck pain.  Skin: Negative for rash.  Neurological: Positive for light-headedness ( positional). Negative for syncope and headaches.     Physical Exam Updated Vital Signs BP 103/64 (BP Location: Right Arm)   Pulse 79   Temp 98.3 F (36.8 C) (Oral)   Resp 18   Ht 5\' 2"  (1.575 m)   Wt 87.5 kg   SpO2 97%   BMI 35.30 kg/m   Physical Exam  Constitutional: She appears well-developed and well-nourished. No distress.  HENT:  Head: Normocephalic and atraumatic.  Mouth/Throat: Oropharynx is clear and moist. No oropharyngeal exudate.  Eyes: Conjunctivae and EOM are normal. Pupils are equal, round, and reactive to light. Right eye exhibits no discharge. Left eye exhibits no discharge. No scleral icterus.  Neck: Normal range of motion. Neck supple.  Cardiovascular: Normal rate, regular rhythm, normal heart sounds and intact distal pulses.   No murmur heard. Pulmonary/Chest: Effort normal and breath sounds normal. No respiratory distress.  Abdominal: Soft. Bowel sounds are normal. There is tenderness in the right upper quadrant, right lower quadrant, epigastric area and periumbilical area. There is no rigidity, no rebound, no guarding and no CVA tenderness.  Musculoskeletal: Normal range  of motion.  Lymphadenopathy:    She has no cervical adenopathy.  Neurological: She is alert. Coordination normal.  Skin: Skin is warm and dry. She is not diaphoretic.  Psychiatric: She has a normal mood and affect. Her behavior is normal.     ED Treatments / Results  Labs (all labs ordered are listed, but only abnormal results are displayed) Labs Reviewed  COMPREHENSIVE METABOLIC PANEL - Abnormal; Notable for the following:       Result Value   Potassium 2.3 (*)    Glucose, Bld 113 (*)    BUN 5 (*)    Calcium 8.6 (*)    Albumin 3.0 (*)    AST 10 (*)    ALT <5 (*)    All other components within normal limits  CBC - Abnormal; Notable for the following:    Hemoglobin 11.8 (*)    HCT 35.2 (*)    Platelets 415 (*)    All other components within normal limits  URINALYSIS, ROUTINE W REFLEX MICROSCOPIC (NOT AT [facility]) - Abnormal; Notable for the following:  Color, Urine AMBER (*)    APPearance CLOUDY (*)    Hgb urine dipstick SMALL (*)    Protein, ur 30 (*)    Leukocytes, UA MODERATE (*)    All other components within normal limits  URINE MICROSCOPIC-ADD ON - Abnormal; Notable for the following:    Squamous Epithelial / LPF TOO NUMEROUS TO COUNT (*)    Bacteria, UA RARE (*)    All other components within normal limits  I-STAT CHEM 8, ED - Abnormal; Notable for the following:    Potassium 2.6 (*)    Chloride 100 (*)    BUN <3 (*)    Glucose, Bld 123 (*)    Calcium, Ion 1.09 (*)    Hemoglobin 9.9 (*)    HCT 29.0 (*)    All other components within normal limits  LIPASE, BLOOD  MAGNESIUM    EKG  EKG Interpretation  Date/Time:  Sunday November 16 2015 19:03:44 EDT Ventricular Rate:  83 PR Interval:  162 QRS Duration: 84 QT Interval:  378 QTC Calculation: 444 R Axis:   1 Text Interpretation:  Normal sinus rhythm Low voltage QRS Inferior infarct , age undetermined Cannot rule out Anterior infarct , age undetermined Abnormal ECG No significant change was found Confirmed by  Read DriversMOLPUS  MD, Jonny RuizJOHN ([id number]) on 11/17/2015 2:00:08 PM       Radiology No results found.  Procedures Procedures (including critical care time)  Medications Ordered in ED Medications  potassium chloride 10 mEq in 100 mL IVPB (0 mEq Intravenous Stopped 11/16/15 2111)  potassium chloride SA (K-DUR,KLOR-CON) CR tablet 40 mEq (40 mEq Oral Given 11/16/15 1756)  sodium chloride 0.9 % bolus 500 mL (0 mLs Intravenous Stopped 11/16/15 2034)  morphine 2 MG/ML injection 2 mg (2 mg Intravenous Given 11/16/15 1853)  ondansetron (ZOFRAN) injection 4 mg (4 mg Intravenous Given 11/16/15 1852)  potassium chloride SA (K-DUR,KLOR-CON) CR tablet 40 mEq (40 mEq Oral Given 11/16/15 2033)  sodium chloride 0.9 % bolus 500 mL (0 mLs Intravenous Stopped 11/16/15 2111)  magnesium sulfate IVPB 1 g 100 mL (0 g Intravenous Stopped 11/16/15 2112)  HYDROcodone-acetaminophen (NORCO/VICODIN) 5-325 MG per tablet 1 tablet (1 tablet Oral Given 11/16/15 2131)     Initial Impression / Assessment and Plan / ED Course  I have reviewed the triage vital signs and the nursing notes.  Pertinent labs & imaging results that were available during my care of the patient were reviewed by me and considered in my medical decision making (see chart for details).  Clinical Course  Comment By Time  Patient endorses improvement in abdominal pain. Remains TTP.  Lona Kettleshley Laurel Kavian Peters, [place]-C 08/20 2000    Patient is afebrile and non-toxic appearing in discomfort. Vital signs remarkable for soft blood pressure at 103/64, otherwise stable. Physical exam remarkable for TTP of epigastric, periumbilical, RUQ, and RLQ. Abdomen is soft, positive bowel sounds. No peritoneal signs. Will check basic labs. No guarding or rigidity. IVF, anti-emetics, and pain medication initiated.   Anemia stable. CMP remarkable for hypokalemia - critical at 2.3 - PO/IV PO initiated. Suspect may be secondary to chronic diarrhea. Magnesium low limit normal - IV Mg given. EKG shows NSR  rhythm with no significant change from previous. Lipase nml - low suspicion for pancreatitis. Patient s/p cholecystectomy and appendectomy. U/A remarkable for leukocytes; however, specimen contaminated, pt asx. Will hold on ABX treatment.   On re-evaluation patient endorses improvement in abdominal pain. Passed PO challenge. Review of records show patient in ED  at begninng of July with same pain in right abdomen. CT abd/pelvis at that time remarkable for active crohn's flare. Discussed with patient concern for Crohn's flare up. Patient is afebrile, VSS, and non-toxic appearing. No leukocytosis. Abdomen soft, positive bowel sounds. No guarding, rigidity, or peritoneal signs. Low suspicion for obstruction or perforation. Participated in shared medical decision making regarding empirically treating vs. Repeat CT abdomen/pelvis. Discussed risks and benefits of scanning. Patient opted to treat vs. Imaging at this time. On [place], potassium trending upward. Will d/c with Rx  Steroids, pain medication, zofran, and PO potassium. Review of [place] controlled substance database shows no recent narcotic rx. Discussed importance of follow up with PCP in 3 days for re-check of potassium. GI referral provided for local provider, encouraged follow up in the next week or two for re-evaluation. Strict return precautions were discussed. Patient voiced understanding and is agreeable.   Final Clinical Impressions(s) / ED Diagnoses   Final diagnoses:  Abdominal pain, unspecified abdominal location  Hypokalemia    New Prescriptions Discharge Medication List as of 11/16/2015  9:47 PM    START taking these medications   Details  ondansetron (ZOFRAN ODT) 4 MG disintegrating tablet Take 1 tablet (4 mg total) by mouth every 8 (eight) hours as needed for nausea or vomiting., Starting Sun 11/16/2015, Print    oxyCODONE-acetaminophen (PERCOCET/ROXICET) 5-325 MG tablet Take 1 tablet by mouth every 6 (six) hours as needed for severe  pain., Starting Sun 11/16/2015, Print    potassium chloride 20 MEQ TBCR Take 40 mEq by mouth daily., Starting Sun 11/16/2015, Print         [place], PA-C 11/18/15 1203    Derwood Kaplan, MD 11/18/15 [phone]

## 2016-07-15 ENCOUNTER — Inpatient Hospital Stay (HOSPITAL_COMMUNITY)
Admission: EM | Admit: 2016-07-15 | Discharge: 2016-07-16 | DRG: 387 | Disposition: A | Discharge status: Home / Self Care | Payer: Medicaid Other | Attending: Internal Medicine | Admitting: Internal Medicine

## 2016-07-15 ENCOUNTER — Encounter (HOSPITAL_COMMUNITY): Payer: Self-pay | Admitting: *Deleted

## 2016-07-15 ENCOUNTER — Emergency Department (HOSPITAL_COMMUNITY): Payer: Medicaid Other

## 2016-07-15 DIAGNOSIS — Z8051 Family history of malignant neoplasm of kidney: Secondary | ICD-10-CM

## 2016-07-15 DIAGNOSIS — Z9119 Patient's noncompliance with other medical treatment and regimen: Secondary | ICD-10-CM | POA: Diagnosis not present

## 2016-07-15 DIAGNOSIS — E876 Hypokalemia: Secondary | ICD-10-CM

## 2016-07-15 DIAGNOSIS — Z79899 Other long term (current) drug therapy: Secondary | ICD-10-CM

## 2016-07-15 DIAGNOSIS — F17200 Nicotine dependence, unspecified, uncomplicated: Secondary | ICD-10-CM | POA: Diagnosis present

## 2016-07-15 DIAGNOSIS — Z8249 Family history of ischemic heart disease and other diseases of the circulatory system: Secondary | ICD-10-CM | POA: Diagnosis not present

## 2016-07-15 DIAGNOSIS — Z79891 Long term (current) use of opiate analgesic: Secondary | ICD-10-CM

## 2016-07-15 DIAGNOSIS — K219 Gastro-esophageal reflux disease without esophagitis: Secondary | ICD-10-CM | POA: Diagnosis present

## 2016-07-15 DIAGNOSIS — Z9114 Patient's other noncompliance with medication regimen: Secondary | ICD-10-CM | POA: Diagnosis not present

## 2016-07-15 DIAGNOSIS — Z9049 Acquired absence of other specified parts of digestive tract: Secondary | ICD-10-CM | POA: Diagnosis not present

## 2016-07-15 DIAGNOSIS — I4581 Long QT syndrome: Secondary | ICD-10-CM | POA: Diagnosis present

## 2016-07-15 DIAGNOSIS — Z720 Tobacco use: Secondary | ICD-10-CM | POA: Diagnosis present

## 2016-07-15 DIAGNOSIS — R1011 Right upper quadrant pain: Secondary | ICD-10-CM | POA: Diagnosis not present

## 2016-07-15 DIAGNOSIS — Z791 Long term (current) use of non-steroidal anti-inflammatories (NSAID): Secondary | ICD-10-CM | POA: Diagnosis not present

## 2016-07-15 DIAGNOSIS — K5000 Crohn's disease of small intestine without complications: Principal | ICD-10-CM | POA: Diagnosis present

## 2016-07-15 DIAGNOSIS — F17210 Nicotine dependence, cigarettes, uncomplicated: Secondary | ICD-10-CM

## 2016-07-15 DIAGNOSIS — Z833 Family history of diabetes mellitus: Secondary | ICD-10-CM

## 2016-07-15 DIAGNOSIS — Z23 Encounter for immunization: Secondary | ICD-10-CM

## 2016-07-15 DIAGNOSIS — D649 Anemia, unspecified: Secondary | ICD-10-CM | POA: Diagnosis not present

## 2016-07-15 DIAGNOSIS — K50919 Crohn's disease, unspecified, with unspecified complications: Secondary | ICD-10-CM

## 2016-07-15 DIAGNOSIS — K5090 Crohn's disease, unspecified, without complications: Secondary | ICD-10-CM | POA: Diagnosis present

## 2016-07-15 DIAGNOSIS — K529 Noninfective gastroenteritis and colitis, unspecified: Secondary | ICD-10-CM

## 2016-07-15 DIAGNOSIS — Z882 Allergy status to sulfonamides status: Secondary | ICD-10-CM | POA: Diagnosis not present

## 2016-07-15 LAB — LIPASE, BLOOD: LIPASE: 30 U/L (ref 11–51)

## 2016-07-15 LAB — CBC
HEMATOCRIT: 33.8 % — AB (ref 36.0–46.0)
HEMOGLOBIN: 11.2 g/dL — AB (ref 12.0–15.0)
MCH: 28.4 pg (ref 26.0–34.0)
MCHC: 33.1 g/dL (ref 30.0–36.0)
MCV: 85.8 fL (ref 78.0–100.0)
Platelets: 449 10*3/uL — ABNORMAL HIGH (ref 150–400)
RBC: 3.94 MIL/uL (ref 3.87–5.11)
RDW: 14.9 % (ref 11.5–15.5)
WBC: 13 10*3/uL — AB (ref 4.0–10.5)

## 2016-07-15 LAB — BASIC METABOLIC PANEL
Anion gap: 5 (ref 5–15)
Anion gap: 4 — ABNORMAL LOW (ref 5–15)
BUN: 6 mg/dL (ref 6–20)
BUN: 5 mg/dL — AB (ref 6–20)
CALCIUM: 7.2 mg/dL — AB (ref 8.9–10.3)
CO2: 25 mmol/L (ref 22–32)
CO2: 24 mmol/L (ref 22–32)
CREATININE: 0.72 mg/dL (ref 0.44–1.00)
CREATININE: 0.7 mg/dL (ref 0.44–1.00)
Calcium: 7.1 mg/dL — ABNORMAL LOW (ref 8.9–10.3)
Chloride: 106 mmol/L (ref 101–111)
Chloride: 109 mmol/L (ref 101–111)
GFR calc Af Amer: >60 mL/min (ref >60)
GFR calc Af Amer: >60 mL/min (ref >60)
GFR calc non Af Amer: >60 mL/min (ref >60)
GLUCOSE: 88 mg/dL (ref 65–99)
Glucose, Bld: 103 mg/dL — ABNORMAL HIGH (ref 65–99)
Potassium: 2.7 mmol/L — CL (ref 3.5–5.1)
Potassium: 3.3 mmol/L — ABNORMAL LOW (ref 3.5–5.1)
Sodium: 136 mmol/L (ref 135–145)
Sodium: 137 mmol/L (ref 135–145)

## 2016-07-15 LAB — C DIFFICILE QUICK SCREEN W PCR REFLEX
C DIFFICLE (CDIFF) ANTIGEN: NEGATIVE
C Diff interpretation: NOT DETECTED
C Diff toxin: NEGATIVE

## 2016-07-15 LAB — COMPREHENSIVE METABOLIC PANEL
ALBUMIN: 2.2 g/dL — AB (ref 3.5–5.0)
ALT: 5 U/L — ABNORMAL LOW (ref 14–54)
ANION GAP: 7 (ref 5–15)
AST: 8 U/L — ABNORMAL LOW (ref 15–41)
Alkaline Phosphatase: 106 U/L (ref 38–126)
BILIRUBIN TOTAL: 0.6 mg/dL (ref 0.3–1.2)
BUN: 8 mg/dL (ref 6–20)
CHLORIDE: 102 mmol/L (ref 101–111)
CO2: 27 mmol/L (ref 22–32)
Calcium: 8.1 mg/dL — ABNORMAL LOW (ref 8.9–10.3)
Creatinine, Ser: 0.86 mg/dL (ref 0.44–1.00)
GFR calc Af Amer: >60 mL/min (ref >60)
GFR calc non Af Amer: >60 mL/min (ref >60)
GLUCOSE: 92 mg/dL (ref 65–99)
POTASSIUM: 2.3 mmol/L — AB (ref 3.5–5.1)
Sodium: 136 mmol/L (ref 135–145)
TOTAL PROTEIN: 5.9 g/dL — AB (ref 6.5–8.1)

## 2016-07-15 LAB — URINALYSIS, ROUTINE W REFLEX MICROSCOPIC
Bilirubin Urine: NEGATIVE
Glucose, UA: NEGATIVE mg/dL
Hgb urine dipstick: NEGATIVE
KETONES UR: 5 mg/dL — AB
LEUKOCYTES UA: NEGATIVE
NITRITE: NEGATIVE
PROTEIN: 30 mg/dL — AB
Specific Gravity, Urine: 1.019 (ref 1.005–1.030)
pH: 5 (ref 5.0–8.0)

## 2016-07-15 LAB — MAGNESIUM: Magnesium: 1.4 mg/dL — ABNORMAL LOW (ref 1.7–2.4)

## 2016-07-15 LAB — POC URINE PREG, ED: PREG TEST UR: NEGATIVE

## 2016-07-15 MED ORDER — SODIUM CHLORIDE 0.9 % IV BOLUS (SEPSIS)
1000.0000 mL | Freq: Once | INTRAVENOUS | Status: AC
  Administered 2016-07-15: 1000 mL via INTRAVENOUS

## 2016-07-15 MED ORDER — HYDROMORPHONE HCL 1 MG/ML IJ SOLN
1.0000 mg | Freq: Once | INTRAMUSCULAR | Status: AC
  Administered 2016-07-15: 1 mg via INTRAVENOUS
  Filled 2016-07-15: qty 1

## 2016-07-15 MED ORDER — PNEUMOCOCCAL VAC POLYVALENT 25 MCG/0.5ML IJ INJ
0.5000 mL | INJECTION | INTRAMUSCULAR | Status: AC
  Administered 2016-07-16: 0.5 mL via INTRAMUSCULAR
  Filled 2016-07-15 (×2): qty 0.5

## 2016-07-15 MED ORDER — BUDESONIDE 3 MG PO CPEP
9.0000 mg | ORAL_CAPSULE | Freq: Every day | ORAL | Status: DC
Start: 2016-07-15 — End: 2016-07-16
  Administered 2016-07-15 – 2016-07-16 (×2): 9 mg via ORAL
  Filled 2016-07-15 (×2): qty 3

## 2016-07-15 MED ORDER — HEPARIN SODIUM (PORCINE) 5000 UNIT/ML IJ SOLN
5000.0000 [IU] | Freq: Three times a day (TID) | INTRAMUSCULAR | Status: DC
  Administered 2016-07-15 – 2016-07-16 (×4): 5000 [IU] via SUBCUTANEOUS
  Filled 2016-07-15 (×4): qty 1

## 2016-07-15 MED ORDER — ACETAMINOPHEN 500 MG PO TABS
1000.0000 mg | ORAL_TABLET | Freq: Three times a day (TID) | ORAL | Status: DC
  Administered 2016-07-15 – 2016-07-16 (×3): 1000 mg via ORAL
  Filled 2016-07-15 (×3): qty 2

## 2016-07-15 MED ORDER — CIPROFLOXACIN IN D5W 400 MG/200ML IV SOLN
400.0000 mg | Freq: Two times a day (BID) | INTRAVENOUS | Status: DC
  Administered 2016-07-15 – 2016-07-16 (×2): 400 mg via INTRAVENOUS
  Filled 2016-07-15 (×2): qty 200

## 2016-07-15 MED ORDER — POTASSIUM CHLORIDE IN NACL 40-0.9 MEQ/L-% IV SOLN
INTRAVENOUS | Status: AC
  Administered 2016-07-15: 100 mL/h via INTRAVENOUS
  Filled 2016-07-15 (×2): qty 1000

## 2016-07-15 MED ORDER — MAGNESIUM SULFATE 2 GM/50ML IV SOLN
2.0000 g | Freq: Once | INTRAVENOUS | Status: AC
  Administered 2016-07-15: 2 g via INTRAVENOUS
  Filled 2016-07-15: qty 50

## 2016-07-15 MED ORDER — BOOST / RESOURCE BREEZE PO LIQD
1.0000 | Freq: Three times a day (TID) | ORAL | Status: DC
  Administered 2016-07-15 – 2016-07-16 (×4): 1 via ORAL

## 2016-07-15 MED ORDER — HYDROMORPHONE HCL 1 MG/ML IJ SOLN
0.5000 mg | INTRAMUSCULAR | Status: DC | PRN
Start: 2016-07-15 — End: 2016-07-16
  Administered 2016-07-15 – 2016-07-16 (×3): 0.5 mg via INTRAVENOUS
  Filled 2016-07-15 (×3): qty 1

## 2016-07-15 MED ORDER — METRONIDAZOLE 500 MG PO TABS
500.0000 mg | ORAL_TABLET | Freq: Three times a day (TID) | ORAL | Status: DC
  Administered 2016-07-15 – 2016-07-16 (×4): 500 mg via ORAL
  Filled 2016-07-15 (×4): qty 1

## 2016-07-15 MED ORDER — POTASSIUM CHLORIDE 2 MEQ/ML IV SOLN
30.0000 meq | Freq: Once | INTRAVENOUS | Status: AC
  Administered 2016-07-15: 30 meq via INTRAVENOUS
  Filled 2016-07-15: qty 15

## 2016-07-15 MED ORDER — NITROGLYCERIN 0.4 MG SL SUBL: 0.4000 mg | SUBLINGUAL_TABLET | SUBLINGUAL | Status: DC | PRN

## 2016-07-15 MED ORDER — HYDROMORPHONE HCL 1 MG/ML IJ SOLN
0.5000 mg | Freq: Once | INTRAMUSCULAR | Status: AC
  Administered 2016-07-15: 0.5 mg via INTRAVENOUS
  Filled 2016-07-15: qty 1

## 2016-07-15 MED ORDER — ONDANSETRON HCL 4 MG/2ML IJ SOLN
4.0000 mg | Freq: Once | INTRAMUSCULAR | Status: AC
  Administered 2016-07-15: 4 mg via INTRAVENOUS
  Filled 2016-07-15: qty 2

## 2016-07-15 MED ORDER — NICOTINE 7 MG/24HR TD PT24
7.0000 mg | MEDICATED_PATCH | Freq: Every day | TRANSDERMAL | Status: DC
  Administered 2016-07-15 – 2016-07-16 (×2): 7 mg via TRANSDERMAL
  Filled 2016-07-15 (×2): qty 1

## 2016-07-15 MED ORDER — ONDANSETRON HCL 4 MG/2ML IJ SOLN: 4.0000 mg | Freq: Three times a day (TID) | INTRAMUSCULAR | Status: DC | PRN

## 2016-07-15 MED ORDER — POTASSIUM CHLORIDE CRYS ER 20 MEQ PO TBCR
40.0000 meq | EXTENDED_RELEASE_TABLET | Freq: Once | ORAL | Status: AC
  Administered 2016-07-15: 40 meq via ORAL
  Filled 2016-07-15: qty 2

## 2016-07-15 MED ORDER — POTASSIUM CHLORIDE 20 MEQ/15ML (10%) PO SOLN
60.0000 meq | Freq: Once | ORAL | Status: AC
  Administered 2016-07-15: 60 meq via ORAL
  Filled 2016-07-15: qty 45

## 2016-07-15 MED ORDER — ONDANSETRON HCL 4 MG PO TABS: 4.0000 mg | ORAL_TABLET | Freq: Three times a day (TID) | ORAL | Status: DC | PRN

## 2016-07-15 MED ORDER — PROMETHAZINE HCL 25 MG PO TABS: 12.5000 mg | ORAL_TABLET | Freq: Four times a day (QID) | ORAL | Status: DC | PRN

## 2016-07-15 MED ORDER — ASPIRIN 325 MG PO TABS: 325.0000 mg | ORAL_TABLET | Freq: Every day | ORAL | Status: DC

## 2016-07-15 MED ORDER — IOPAMIDOL (ISOVUE-300) INJECTION 61%
INTRAVENOUS | Status: AC
  Administered 2016-07-15: 100 mL
  Filled 2016-07-15: qty 100

## 2016-07-15 MED ORDER — MORPHINE SULFATE (PF) 4 MG/ML IV SOLN
2.0000 mg | INTRAVENOUS | Status: DC | PRN
  Administered 2016-07-15: 2 mg via INTRAVENOUS
  Filled 2016-07-15: qty 1

## 2016-07-15 MED ORDER — PIPERACILLIN-TAZOBACTAM 3.375 G IVPB
3.3750 g | Freq: Three times a day (TID) | INTRAVENOUS | Status: DC
  Filled 2016-07-15 (×2): qty 50

## 2016-07-15 MED ORDER — PIPERACILLIN-TAZOBACTAM 3.375 G IVPB 30 MIN
3.3750 g | Freq: Once | INTRAVENOUS | Status: AC
  Administered 2016-07-15: 3.375 g via INTRAVENOUS
  Filled 2016-07-15: qty 50

## 2016-07-15 NOTE — ED Notes (Signed)
Admitting provider at bedside.

## 2016-07-15 NOTE — Progress Notes (Signed)
Patient stated that morphine q4h was not sufficient pain management and threatened to leave AMA if not given more pain medication.  RN contacted physician who accommodated patients needs.

## 2016-07-15 NOTE — H&P (Signed)
Date: 07/15/2016               Patient Name:  Julie [name] MRN: [id number]  DOB: 1975/11/14 Age / Sex: 41 y.o., female   PCP: Pcp Not In System              Medical Service: Internal Medicine Teaching Service              Attending Physician: Dr. Bartholomew Crews, MD    First Contact: Malen Gauze, MS IV Pager: [phone]  Second Contact: Dr. Zada Finders Pager: [phone]            After Hours (After 5p/  First Contact Pager: [phone]  weekends / holidays): Second Contact Pager: [phone]   Chief Complaint: Abdominal pain  History of Present Illness: Julie [name] is a 41 y.o. Female with PHMx Crohn's disease and tobacco abuse who presents to the ED for constant, sharp, moderate to severe, worsening, right sided abdominal pain with an onset last night. Patient reports that her pain comes in waves, like contractions, but it is always present. Rates the severity of the pain 10/10 at presentation, 9/10 after pain meds in the ED which have mildly alleviated her pain. Moving around aggravates her pain. Patient reports similar pain about 4 years ago when she had a stricture; however, patient reports that her current pain radiates from her right flank to her back, which is different than 4 years ago. Patient reports she vomited 5-6 times last night, at first stomach contents/food then more watery. She takes Zofran and Phenergan at home which did not help. She has not vomited since arrival to the hospital. Patient has baseline diarrhea, about 20-25 watery bowel movements a day, varying in size. Denies any hematemesis, hematochezia, or melena. Denies dysuria or changes in urinary frequency. Patient does report that she has not been taking her Crohn's disease medication, budesonide, for the past week; says she ran out. Reports she has lost 25 lbs in the last 6 months.  Crohn's history: diagnosed in 06/2012, s/p ileocecectomy in 2014 due to bowel obstruction, has failed multiple medical treatments in the  past including Humira, 6-MP, azathioprine, Remicade due to side effects, non-compliance, or no effect. Patient had recent Crohn's flare in 09/2015; was given prednisone at that time. Patient reports that she absolutely will not take prednisone or IV steroids; reports she has side effect of mania whenever she takes them. Patient's primary gastroenterologist is Dr. Redmond Pulling at [facility]. Patient recently had follow-up with him earlier this month and was scheduled to have MR enterography later this month to evaluate for any complications such as fibrostenosis.  ED Course: Patient was afebrile, normotensive 114/71, non-tachycardic, non-tachypneic, satting well 100% on room air. Leukocytosis with WBC 13, mild anemia 11.2. Hypokalemia 2.3 with hypomagnesemia 1.4. UA and UPC negative. Lipase normal. Liver enzymes normal. EKG showed NSR with RAD and borderline QTc prolongation. Abd/pelvis CT showed evidence of colitis and terminal ileitis given colonic wall thickening and terminal ileum with inflammatory changes; questionable loculated fluid in the RLQ abdomen that may reflect a developing abscess. One of our team leaders, Dr. Martyn Malay, reviewed these images with radiology. There did not seem to be any evidence of a drainable abscess by IR or general surgery.  Meds: Current Facility-Administered Medications  Medication Dose Route Frequency Provider Last Rate Last Dose  . 0.9 % NaCl with KCl 40 mEq / L  infusion   Intravenous Continuous Alexa Angela Burke, MD 100 mL/hr at 07/15/16  1341 100 mL/hr at 07/15/16 1341  . budesonide (ENTOCORT EC) 24 hr capsule 9 mg  9 mg Oral Daily Alexa Angela Burke, MD   9 mg at 07/15/16 1541  . ciprofloxacin (CIPRO) IVPB 400 mg  400 mg Intravenous Q12H Alexa R Burns, MD      . heparin injection 5,000 Units  5,000 Units Subcutaneous Q8H Alexa R Burns, MD      . magnesium sulfate IVPB 2 g 50 mL  2 g Intravenous Once Alexa R Burns, MD      . metroNIDAZOLE (FLAGYL) tablet 500 mg  500 mg Oral Q8H  Alexa R Burns, MD   500 mg at 07/15/16 1541  . morphine 4 MG/ML injection 2 mg  2 mg Intravenous Q4H PRN Florinda Marker, MD   2 mg at 07/15/16 1341  . nicotine (NICODERM CQ - dosed in mg/24 hr) patch 7 mg  7 mg Transdermal Daily Alexa Angela Burke, MD   7 mg at 07/15/16 1540  . ondansetron (ZOFRAN) injection 4 mg  4 mg Intravenous Q8H PRN Alexa Angela Burke, MD       Or  . ondansetron (ZOFRAN) tablet 4 mg  4 mg Oral Q8H PRN Alexa Angela Burke, MD        Allergies: Allergies as of 07/15/2016 - Review Complete 07/15/2016  Allergen Reaction Noted  . Sulfonamide derivatives Nausea And Vomiting 09/19/2009   Past Medical History:  Diagnosis Date  . Acid reflux   . B12 deficiency 11/2014   initiated on IM B12 11/2014  . Chronic abdominal pain 2014  . Crohn disease ([place]) 2014   ileal and colonic dz.  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 10/2014 colonoscopy: ileal anastomotic stricture and colitis.  Remicade initiated 11/2014.  normal TPMT activity (18.3) 11/2014.  . Obesity 2011   314# in 2011   Past Surgical History:  Procedure Laterality Date  . CESAREAN SECTION  2003   vaginal delivery 2000  . COLON SURGERY  2014   terminal ileum and right colon resection  . COLONOSCOPY  10/31/14   + active Crohns severe colitis and anastomotic stricture in ileum   . ESOPHAGOGASTRODUODENOSCOPY  10/2014   normal  . LAPAROSCOPIC CHOLECYSTECTOMY  2010   Family History  Problem Relation Age of Onset  . Diabetes Mellitus II Mother   . Hypertension Father   . Kidney cancer Maternal Grandmother    Social History   Social History  . Marital status: Single    Spouse name: N/A  . Number of children: N/A  . Years of education: N/A   Occupational History  . Not on file.   Social History Main Topics  . Smoking status: Current Every Day Smoker    Packs/day: 1.00  . Smokeless tobacco: Never Used  . Alcohol use No  . Drug use: No  . Sexual activity: Yes   Other Topics Concern  . Not on file   Social  History Narrative  . No narrative on file    Review of Systems: Pertinent items noted in HPI and remainder of comprehensive ROS otherwise negative.  Physical Exam: Blood pressure (!) 88/49, pulse 69, temperature 98.4 F (36.9 C), temperature source Oral, resp. rate 16, height _0  (1.575 m), weight 78 kg (172 lb), SpO2 100 %. General: Vital signs reviewed.  Patient is in mild acute distress and cooperative with exam.  HEENT: NCAT, PERRL, conjunctivae normal, no scleral icterus, MMM Cardiovascular: RRR w/o m/r/g Pulmonary: CTAB without wheezes, rales, or rhonchi Gastrointestinal:  RLQ and Right Mid Quadrant TTP, soft, non-distended, normal BS, no guarding present.  Extremities: No peripheral edema in all extremities. Intact PT and DP pulses. Neurologic: A&O x 3. Moving all extremities equally.  Skin: Warm, dry and intact. No rashes or erythema. Psychiatric: Normal mood and affect. speech and behavior is normal. Cognition and memory are normal.   Lab results: Reviewed in EMR  Imaging results:  Ct Abdomen Pelvis W Contrast  Result Date: 07/15/2016 CLINICAL DATA:  Mid abdominal pain, flank pain since yesterday EXAM: CT ABDOMEN AND PELVIS WITH CONTRAST TECHNIQUE: Multidetector CT imaging of the abdomen and pelvis was performed using the standard protocol following bolus administration of intravenous contrast. CONTRAST:  1 ISOVUE-300 IOPAMIDOL (ISOVUE-300) INJECTION 61% COMPARISON:  09/30/2015 FINDINGS: Lower chest: No acute abnormality. Hepatobiliary: No focal liver abnormality is seen. Status post cholecystectomy. No biliary dilatation. Pancreas: Unremarkable. No pancreatic ductal dilatation or surrounding inflammatory changes. Spleen: Normal in size without focal abnormality. Adrenals/Urinary Tract: Adrenal glands are unremarkable. Kidneys are normal, without renal calculi, focal lesion, or hydronephrosis. Bladder is unremarkable. Stomach/Bowel: Stomach is within normal limits. Colonic wall  thickening most severe in the cecum and also terminal ilium with surrounding inflammatory changes and fluid. Some of the fluid appears loculated. No pneumatosis or pneumoperitoneum. Vascular/Lymphatic: No significant vascular findings are present. Mild abdominal aortic atherosclerosis. No enlarged abdominal or pelvic lymph nodes. Reproductive: Uterus and bilateral adnexa are unremarkable. Other: No abdominal wall hernia or abnormality. No abdominopelvic ascites. Musculoskeletal:  Broad left paracentral disc protrusion at L5-S1. IMPRESSION: 1. Colonic wall thickening most severe in the cecum and also terminal ilium with severe surrounding inflammatory changes and fluid. Relative thickening of the remainder of the colon without surrounding inflammatory changes. Findings are most consistent with colitis and terminal ileitis given the patient's history of Crohn's disease. Some of the fluid which appears loculated in the right lower quadrant may reflect developing abscess. Electronically Signed   By: Kathreen Devoid   On: 07/15/2016 10:30    Other results: EKG: NSR with RAD and borderline QTc prolongation  Assessment & Plan by Problem: Principal Problem:   Exacerbation of Crohn's disease (HCC) Active Problems:   Hypokalemia   Tobacco abuse  Julie [name] is a 41 y.o. Female with PHMx Crohn's disease and tobacco abuse who presented 07/15/2016 for abdominal pain.  1. Crohn's exacerbation - Patient with history of poorly controlled Crohn's disease, currently not taking her maintenance medication for the past week, presented with constant, sharp, moderate to severe, worsening, right sided abdominal pain without worsening n/v/d and without hematochezia, with mild leukocytosis, with abd/pelvic CT showing evidence of colitis and terminal ileitis. This is most likely a Crohn's exacerbation due to non-compliance with her budesonide. A developing abscess in the RLQ could also be on the differential, but with no fever or  hemodynamic instability and mild leukocytosis, as well as questionable imaging, this is less likely. We will restart her home dose budesonide 9 mg daily. We will switch her antibiotics from Zosyn to Cipro + metronidazole which has been shown to be better for microbes associated with Crohn's disease ([email or web address]). We will check C diff PCR to make sure diarrhea is related to Crohn's and not C diff. We will continue supportive therapies. - Budesonide 9 mg daily - Morphine and Zofran prn (patient was threatening to leave AMA in ED as well as on floor if Dilaudid was not given; will switch to Dilaudid at same equivalent as current morphine regimen and scheduled Tylenol) - IV Cipro  400 mg bid + PO metronidazole 500 mg tid - C diff PCR - NS IVFs - Clear liquid diet - Considering repeat abdominal imaging to evaluate questionable drainable abscess - Trend CBC for leukocytosis - Trend BMP for electrolyte abnormalities (see below) - ESR/CRP tomorrow AM - Follow-up with her gastroenterologist outpatient at [facility] - Dr. Redmond Pulling [phone]  2. Hypomagnesemia / hypokalemia - Mg 1.4, K 2.3 at presentation. EKG showed NSR with RAD and borderline QTc prolongation. Mg and subsequently K are most likely low due to chronic diarrhea. She does not take any oral supplements at home. Received 30 mEq in the ED. Will continue to supplement. - KDur 40 mEq once - NS 40 mEq KCl 100 mL/hr for 12 hours - Mg sulfate 2 g - Trend BMP and Mg - Telemetry - Discharge on oral K supplement  3. Tobacco Abuse - 1/2 PPD x 24 years. Patient was counseled on smoking cessation. - Counseled on smoking cessation - Nicotine patch  4. Hypotension - at exam, patient's BP was 88/49, non-tachycardic 69 without fever. Patient's BP baseline is usually 84'R systolically, per patient. Likely due to dehydration with excessive diarrhea. Patient denies dizziness or lightheadedness. Will CTM her CV status and treat  accordingly.   This is a Careers information officer Note.  The care of the patient was discussed with Dr. Lynnae January and the assessment and plan was formulated with their assistance.  Please see their note for official documentation of the patient encounter.   Signed: Esaw Dace, Medical Student 07/15/2016, 3:49 PM

## 2016-07-15 NOTE — ED Notes (Signed)
Patient transported to CT 

## 2016-07-15 NOTE — ED Provider Notes (Signed)
[facility]-EMERGENCY DEPT Provider Note   CSN: [id number] Arrival date & time: 07/15/16  4098     History   Chief Complaint Chief Complaint  Patient presents with  . Abdominal Pain    HPI Julie [name] is a 41 y.o. female.  HPI Julie [name] is a 41 y.o. female with a history of chronic abdominal pain, Crohn's disease, anemia, presents to emergency department complaining of abdominal pain. Pt states pain started last night. Pain mainly in right upper abdomen, radiates into the back. Reports more than 5 episodes of emesis. Last bowel movement was this morning and states was "harder than usual." Denies fever or chills. No urinary symptoms. Reports symptoms similar to prior "blockage" but reports back pain is new. No Urinary symptoms. Did not take anything for pain this morning prior to coming in.   Past Medical History:  Diagnosis Date  . Acid reflux   . B12 deficiency 11/2014   initiated on IM B12 11/2014  . Chronic abdominal pain 2014  . Crohn disease (HCC) 2014   ileal and colonic dz.  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 10/2014 colonoscopy: ileal anastomotic stricture and colitis.  Remicade initiated 11/2014.  normal TPMT activity (18.3) 11/2014.  . Obesity 2011   314# in 2011    Patient Active Problem List   Diagnosis Date Noted  . Hypokalemia 09/30/2015  . Exacerbation of Crohn's disease (HCC) 09/30/2015  . SHORTNESS OF BREATH 09/19/2009  . CHEST PAIN UNSPECIFIED 09/19/2009    Past Surgical History:  Procedure Laterality Date  . CESAREAN SECTION  2003   vaginal delivery 2000  . COLON SURGERY  2014   terminal ileum and right colon resection  . COLONOSCOPY  10/31/14   + active Crohns severe colitis and anastomotic stricture in ileum   . ESOPHAGOGASTRODUODENOSCOPY  10/2014   normal  . LAPAROSCOPIC CHOLECYSTECTOMY  2010    OB History    No data available       Home Medications    Prior to Admission medications   Medication Sig Start Date End Date Taking?  Authorizing Provider  acetaminophen (TYLENOL) 500 MG tablet Take 1,500 mg by mouth every 6 (six) hours as needed for moderate pain or headache.    Historical Provider, MD  azaTHIOprine (IMURAN) 50 MG tablet Take 50 mg by mouth daily.    Historical Provider, MD  hydrOXYzine (ATARAX/VISTARIL) 50 MG tablet Take 50 mg by mouth 2 (two) times daily as needed for anxiety.    Historical Provider, MD  Melatonin 10 MG TABS Take 10 mg by mouth at bedtime.    Historical Provider, MD  omeprazole (PRILOSEC) 20 MG capsule Take 20 mg by mouth daily.    Historical Provider, MD  ondansetron (ZOFRAN ODT) 4 MG disintegrating tablet Take 1 tablet (4 mg total) by mouth every 8 (eight) hours as needed for nausea or vomiting. 11/16/15   Deborha Payment, PA-C  oxyCODONE-acetaminophen (PERCOCET/ROXICET) 5-325 MG tablet Take 1 tablet by mouth every 6 (six) hours as needed for severe pain. 11/16/15   Deborha Payment, PA-C  potassium chloride 20 MEQ TBCR Take 40 mEq by mouth daily. 11/16/15   Deborha Payment, PA-C  predniSONE (DELTASONE) 20 MG tablet Take 2 tablets (40 mg total) by mouth daily. 11/16/15   Deborha Payment, PA-C  simethicone (GAS-X) 80 MG chewable tablet Chew 80 mg by mouth every 6 (six) hours as needed for flatulence.    Historical Provider, MD    Family History Family History  Problem Relation Age of Onset  . Diabetes Mellitus II Mother   . Hypertension Father   . Kidney cancer Maternal Grandmother     Social History Social History  Substance Use Topics  . Smoking status: Current Every Day Smoker    Packs/day: 1.00  . Smokeless tobacco: Never Used  . Alcohol use No     Allergies   Sulfonamide derivatives   Review of Systems Review of Systems  Constitutional: Negative for chills and fever.  Respiratory: Negative for cough, chest tightness and shortness of breath.   Cardiovascular: Negative for chest pain, palpitations and leg swelling.  Gastrointestinal: Positive for abdominal pain, nausea and  vomiting. Negative for diarrhea.  Genitourinary: Negative for dysuria, flank pain, pelvic pain, vaginal bleeding, vaginal discharge and vaginal pain.  Musculoskeletal: Negative for arthralgias, myalgias, neck pain and neck stiffness.  Skin: Negative for rash.  Neurological: Negative for dizziness, weakness and headaches.  All other systems reviewed and are negative.    Physical Exam Updated Vital Signs BP 114/71 (BP Location: Right Arm)   Pulse 93   Temp 98.6 F (37 C) (Oral)   Resp 18   Ht 5\' 2"  (1.575 m)   Wt 78 kg   SpO2 100%   BMI 31.46 kg/m   Physical Exam  Constitutional: She appears well-developed and well-nourished. No distress.  HENT:  Head: Normocephalic.  Eyes: Conjunctivae are normal.  Neck: Neck supple.  Cardiovascular: Normal rate, regular rhythm and normal heart sounds.   Pulmonary/Chest: Effort normal and breath sounds normal. No respiratory distress. She has no wheezes. She has no rales.  Abdominal: Soft. Bowel sounds are normal. She exhibits no distension. There is tenderness. There is no rebound.  RUQ tenderness. No guarding. No CVA tenderness  Musculoskeletal: She exhibits no edema.  Neurological: She is alert.  Skin: Skin is warm and dry.  Psychiatric: She has a normal mood and affect. Her behavior is normal.  Nursing note and vitals reviewed.    ED Treatments / Results  Labs (all labs ordered are listed, but only abnormal results are displayed) Labs Reviewed  COMPREHENSIVE METABOLIC PANEL - Abnormal; Notable for the following:       Result Value   Potassium 2.3 (*)    Calcium 8.1 (*)    Total Protein 5.9 (*)    Albumin 2.2 (*)    AST 8 (*)    ALT 5 (*)    All other components within normal limits  CBC - Abnormal; Notable for the following:    WBC 13.0 (*)    Hemoglobin 11.2 (*)    HCT 33.8 (*)    Platelets 449 (*)    All other components within normal limits  URINALYSIS, ROUTINE W REFLEX MICROSCOPIC - Abnormal; Notable for the  following:    APPearance HAZY (*)    Ketones, ur 5 (*)    Protein, ur 30 (*)    Bacteria, UA RARE (*)    Squamous Epithelial / LPF 6-30 (*)    All other components within normal limits  LIPASE, BLOOD  POC URINE PREG, ED    EKG  EKG Interpretation  Date/Time:  Thursday July 15 2016 10:45:10 EDT Ventricular Rate:  79 PR Interval:    QRS Duration: 109 QT Interval:  430 QTC Calculation: 493 R Axis:   122 Text Interpretation:  Sinus rhythm Right axis deviation Low voltage, precordial leads Borderline T abnormalities, anterior leads Borderline prolonged QT interval No significant change since last tracing Confirmed by FLOYD MD,  DANIEL [phone]) on 07/15/2016 10:48:41 AM       Radiology Ct Abdomen Pelvis W Contrast  Result Date: 07/15/2016 CLINICAL DATA:  Mid abdominal pain, flank pain since yesterday EXAM: CT ABDOMEN AND PELVIS WITH CONTRAST TECHNIQUE: Multidetector CT imaging of the abdomen and pelvis was performed using the standard protocol following bolus administration of intravenous contrast. CONTRAST:  1 ISOVUE-300 IOPAMIDOL (ISOVUE-300) INJECTION 61% COMPARISON:  09/30/2015 FINDINGS: Lower chest: No acute abnormality. Hepatobiliary: No focal liver abnormality is seen. Status post cholecystectomy. No biliary dilatation. Pancreas: Unremarkable. No pancreatic ductal dilatation or surrounding inflammatory changes. Spleen: Normal in size without focal abnormality. Adrenals/Urinary Tract: Adrenal glands are unremarkable. Kidneys are normal, without renal calculi, focal lesion, or hydronephrosis. Bladder is unremarkable. Stomach/Bowel: Stomach is within normal limits. Colonic wall thickening most severe in the cecum and also terminal ilium with surrounding inflammatory changes and fluid. Some of the fluid appears loculated. No pneumatosis or pneumoperitoneum. Vascular/Lymphatic: No significant vascular findings are present. Mild abdominal aortic atherosclerosis. No enlarged abdominal or pelvic  lymph nodes. Reproductive: Uterus and bilateral adnexa are unremarkable. Other: No abdominal wall hernia or abnormality. No abdominopelvic ascites. Musculoskeletal:  Broad left paracentral disc protrusion at L5-S1. IMPRESSION: 1. Colonic wall thickening most severe in the cecum and also terminal ilium with severe surrounding inflammatory changes and fluid. Relative thickening of the remainder of the colon without surrounding inflammatory changes. Findings are most consistent with colitis and terminal ileitis given the patient's history of Crohn's disease. Some of the fluid which appears loculated in the right lower quadrant may reflect developing abscess. Electronically Signed   By: Elige Ko   On: 07/15/2016 10:30    Procedures Procedures (including critical care time)  Medications Ordered in ED Medications  HYDROmorphone (DILAUDID) injection 1 mg (not administered)  ondansetron (ZOFRAN) injection 4 mg (not administered)  sodium chloride 0.9 % bolus 1,000 mL (not administered)     Initial Impression / Assessment and Plan / ED Course  I have reviewed the triage vital signs and the nursing notes.  Pertinent labs & imaging results that were available during my care of the patient were reviewed by me and considered in my medical decision making (see chart for details).     Pt in ED with abdominal pain. Hx of crohn's disease. Pt also has hx of SBO. Will get labs, CT abd and pelvis.   Potassium 2.3, will give 30 mEq IV. CT scan of abdomen and pelvis pending. White blood cell count is elevated to 13.  11:13 AM CT scan showing colitis with possibly developing abscess. Patient had some pain improvement with Dilaudid IV. I have put order for Zosyn, will call for admission.  Spoke with teaching service, will admit.  Vitals:   07/15/16 1015 07/15/16 1045 07/15/16 1100 07/15/16 1115  BP: (!) 98/56 (!) 98/49 (!) 97/59 (!) 97/56  Pulse: 77 79 74 73  Resp:  19 11 19   Temp:      TempSrc:        SpO2: 100% 100% 100% 99%  Weight:      Height:         Final Clinical Impressions(s) / ED Diagnoses   Final diagnoses:  Colitis  Hypokalemia    New Prescriptions New Prescriptions   No medications on file     Jaynie Crumble, PA-C 07/15/16 1145    Melene Plan, DO 07/15/16 1150

## 2016-07-15 NOTE — ED Triage Notes (Signed)
Pt reports onset last night of right side abd pain that radiates around to her back. Denies urinary symptoms. Having nausea and reports chronic diarrhea, last bm was this am. Hx of crohns and bowel blockage.

## 2016-07-15 NOTE — H&P (Signed)
Date: 07/15/2016               Patient Name:  Julie [name] MRN: [id number]  DOB: 11/15/1975 Age / Sex: 41 y.o.,  female   PCP: Pcp Not In System         Medical Service: Internal Medicine Teaching Service         Attending Physician: Dr. Bartholomew Crews, MD    First Contact: Dr. Philipp Ovens Pager: [phone]  Second Contact: Dr. Posey Pronto Pager: [phone]       After Hours (After 5p/  First Contact Pager: [phone]  weekends / holidays): Second Contact Pager: [phone]   Chief Complaint: abdominal pain  History of Present Illness: Julie [name] is a 87 F with PMHx of poorly controlled Crohn's Disease (Diagnosed 2014, s/p ileocecectomy 2014 and failed multiple treatments including Humira, 6-MP, AZA, Remicade) and on-going tobacco abuse who presents to the emergency department with complaint of abdominal pain. Patient states that last night she developed onset of severe, right-sided, nonradiating sharp, stabbing abdominal pain. She describes the pain like contractions as it comes in waves. Movement makes her pain worse. Pain medications make her pain better. Patient has chronic nausea, vomiting and diarrhea. At baseline, patient has 20-25 watery, large diarrhea bowel movements per day. They are normally yellow in color. She denies any worsening of her nausea, vomiting or diarrhea. She denies any hematemesis, melena or hematochezia. She denies fever, chills, chest pain, shortness of breath. However, she has noticed recent palpitations and lightheadedness when she stands. The only daily medications she is currently taking his Zofran and Phenergan. Per her most recent gastroenterology note, patient is supposed to be on Entocort (budesonide) 9 mg daily, but patient states she ran out one week ago.  In regards to her Crohn's disease, patient was first diagnosed in April 2014 after undergoing ileocecectomy due to bowel obstruction. Since that time, patient has been on multiple different medications and has  failed treatment either due to side effects, noncompliance or ineffectiveness. These medications include Humira, 6-MP, Remicade, Imuran. Patiently adamantly refuses to take prednisone or IV steroids given the side effect of mania. She does tolerate budesonide. Patient was admitted for a Crohn's flare in July 2017 and at that time treated with prednisone orally with a prednisone taper. However, patient is adamant that she will not take prednisone this admission. Patient was recently seen on 06/28/16 by her gastroenterology, Dr. Redmond Pulling, at [facility]. Recommendations at that visit were to obtain MRE which is scheduled later this month to evaluate for fibrostenosis or other complication, consider starting humira, remicade, stelara or colorectal surgery. PPI was recommended given NSAID abuse. Patient was counseled to avoid NSAIDs and to stop smoking.   In the emergency department, patient was afebrile, blood pressure 114/71, heart rate 93, respiratory rate 18, satting 100% on room air. Laboratory workup was significant for a mild leukocytosis of 13, hemoglobin 11.2. Basic metabolic panel showed hypokalemia at 2.3. Hypomagnesemia at 1.4. Urinalysis was negative for signs of infection. Pregnancy test negative. Lipase normal. AST and ALTs normal. EKG was sinus rhythm with right axis deviation with borderline QTC prolongation. CT abdomen and pelvis showed evidence of colitis and terminal ileitis given colonic wall thickening and terminal ileum with inflammatory changes. There was also question of loculated fluid in the right lower quadrant that may reflect developing abscess. I personally reviewed these images and then reviewed the images again with radiology. Currently, there is no evidence of drainable abscess via IR or  general surgery.   Meds: Current Facility-Administered Medications  Medication Dose Route Frequency Provider Last Rate Last Dose  . 0.9 % NaCl with KCl 40 mEq / L  infusion   Intravenous Continuous Alexa R  Burns, MD 100 mL/hr at 07/15/16 1341 100 mL/hr at 07/15/16 1341  . budesonide (ENTOCORT EC) 24 hr capsule 9 mg  9 mg Oral Daily Alexa R Burns, MD      . magnesium sulfate IVPB 2 g 50 mL  2 g Intravenous Once Alexa R Burns, MD      . morphine 4 MG/ML injection 2 mg  2 mg Intravenous Q4H PRN Florinda Marker, MD   2 mg at 07/15/16 1341  . nicotine (NICODERM CQ - dosed in mg/24 hr) patch 7 mg  7 mg Transdermal Daily Alexa R Burns, MD      . ondansetron (ZOFRAN) injection 4 mg  4 mg Intravenous Q8H PRN Alexa R Burns, MD       Or  . ondansetron (ZOFRAN) tablet 4 mg  4 mg Oral Q8H PRN Alexa Angela Burke, MD       Current Outpatient Prescriptions  Medication Sig Dispense Refill  . acetaminophen (TYLENOL) 500 MG tablet Take 1,500 mg by mouth every 6 (six) hours as needed for moderate pain or headache.    . cyanocobalamin (,VITAMIN B-12,) 1000 MCG/ML injection Inject 1,000 mcg into the muscle every 30 (thirty) days.  12  . ibuprofen (ADVIL,MOTRIN) 200 MG tablet Take 200 mg by mouth every 6 (six) hours as needed for moderate pain.    Marland Kitchen ondansetron (ZOFRAN-ODT) 4 MG disintegrating tablet Take 4 mg by mouth every 8 (eight) hours as needed for nausea or vomiting.    . promethazine (PHENERGAN) 12.5 MG tablet Take 25 mg by mouth every 6 (six) hours as needed for nausea/vomiting.  1  . ondansetron (ZOFRAN ODT) 4 MG disintegrating tablet Take 1 tablet (4 mg total) by mouth every 8 (eight) hours as needed for nausea or vomiting. (Patient not taking: Reported on 07/15/2016) 20 tablet 0  . oxyCODONE-acetaminophen (PERCOCET/ROXICET) 5-325 MG tablet Take 1 tablet by mouth every 6 (six) hours as needed for severe pain. (Patient not taking: Reported on 07/15/2016) 12 tablet 0  . potassium chloride 20 MEQ TBCR Take 40 mEq by mouth daily. (Patient not taking: Reported on 07/15/2016) 10 tablet 0  . predniSONE (DELTASONE) 20 MG tablet Take 2 tablets (40 mg total) by mouth daily. (Patient not taking: Reported on 07/15/2016) 20 tablet 0      Allergies: Allergies as of 07/15/2016 - Review Complete 07/15/2016  Allergen Reaction Noted  . Sulfonamide derivatives Nausea And Vomiting 09/19/2009   Past Medical History:  Diagnosis Date  . Acid reflux   . B12 deficiency 11/2014   initiated on IM B12 11/2014  . Chronic abdominal pain 2014  . Crohn disease ([place]) 2014   ileal and colonic dz.  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 10/2014 colonoscopy: ileal anastomotic stricture and colitis.  Remicade initiated 11/2014.  normal TPMT activity (18.3) 11/2014.  . Obesity 2011   314# in 2011    Family History:  Father: CHF  Social History:  Tobacco Use: 1/2 ppd x 24 years Alcohol Use: Denies Illicit Drug Use: Denies  Review of Systems: A complete ROS was reviewed and negative except as per HPI.   Physical Exam: Blood pressure (!) 95/54, pulse 72, temperature 98.6 F (37 C), temperature source Oral, resp. rate 16, height '5\' 2"'$  (1.575 m), weight 172 lb (  78 kg), SpO2 100 %. General: Vital signs reviewed.  Patient is in mild acute distress and cooperative with exam.  Eyes: PERRL, conjunctivae normal, no scleral icterus.  Ears, Nose, Throat, and Mouth: Normal bilateral nasal turbinates. Normal posterior oropharynx. Moist mucous membranes.  Cardiovascular: RRR,  no murmurs, gallops, or rubs. No JVD or carotid bruit present. No lower extremity edema bilaterally. Bilateral radial and pedal pulses are intact and symmetric bilaterally.  Pulmonary: Clear to auscultation bilaterally, no wheezes, rales, or rhonchi. No accessory muscle use. Gastrointestinal: Soft, tender to palpation in RLQ and Right Mid Quadrant, non-distended, BS +, no guarding present.  Neurologic: Awake, alert, answers all questions appropriately. Moving all extremities equally.  Skin: Warm, dry and intact. No rashes or erythema. scattered excoriations on left hand and shins.  Psychiatric: Normal mood and affect. speech and behavior is normal. Cognition and memory  are normal.   EKG: Sinus rhythm, Right Axis Deviation, Prolonged QTc  CT abd/pelvis:  Colonic wall thickening most severe in the cecum and also terminal ilium with severe surrounding inflammatory changes and fluid. Relative thickening of the remainder of the colon without surrounding inflammatory changes. Findings are most consistent with colitis and terminal ileitis given the patient's history of Crohn's disease. Some of the fluid which appears loculated in the right lower quadrant may reflect developing abscess.  Colonoscopy 10/2014: showed an ulcerated and strictured ileocolic anastomosis with normal colonic mucosa. 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 She was treated with imuran and completed remicade loading only in 11/2014 however her symptoms did not improve. Her symptoms did not respond to IV remicade loading in the past however she was lost to follow-up thus it is difficult to discern if she would've had benefit with further infusions.   Assessment & Plan by Problem: Principal Problem:   Exacerbation of Crohn's disease ([place]) Active Problems:   Hypokalemia   Tobacco abuse  Ms. Folson is a 81 F with PMHx of poorly controlled Crohn's Disease (Diagnosed 2014, s/p ileocecectomy 2014 and failed multiple treatments including Humira, 6-MP, AZA, Remicade) and on-going tobacco abuse who presents to the emergency department with complaint of abdominal pain.   Mild Exacerbation of Crohn's Disease: Patient presents with a one day history of severe right sided abdominal pain and mild leukocytosis that is not associated with increased nausea, vomiting, diarrhea, or fever. Patient ran out of her budesonide one week ago, which is likely the cause of her current flare. At baseline, patient has poorly controlled Crohn's disease due to failure of medications and noncompliance. She has lost 25 pounds in the last 6 months. Patient follows with [facility]  gastroenterology. Given suggestion of underlying developing abscess, I will continue patient on Zosyn at this time. We will check a C. difficile PCR. Patient adamantly refuses IV steroids or oral prednisone. She is on the budesonide at home, but has run out. The studies I can find showed that one study from 1997 shows that the budesonide is as effective as prednisolone that a [place] review from 2015 showed that budesonide was significantly less effective than conventional steroids for induction of remission in people with Crohn's disease. However, fewer side effects occurred in those treated with budesonide compared to conventional steroids. Given her limited options, I will restart budesonide 9 mg daily. Patient will need to have close follow-up with outpatient gastroenterology. -Admit to inpatient -Budesonide 9 mg daily -Morphine 2 mg every 4 hours when necessary -Zofran 4 mg every 8 hours when necessary, cautioned  for QTc -Normal saline -Clear liquid diet -Zosyn -May need repeat imaging to ensure no development of discrete abscess prior to discharge -Check C. difficile PCR -Will need close follow-up with outpatient gastroenterology -Repeat CBC/BMET tomorrow morning -ESR/CRP tomorrow morning -Dr. Redmond Pulling: [phone]  Hypokalemia and Hypomagnesemia: History of hypokalemia likely secondary to diarrhea. Potassium 2.3 on admission. Magnesium 1.4. She is not taking daily oral potassium replacement. -Received Potassium 30 mEq in the ED -KDur 40 mEq once -NS with 40 mEq KCl 100 cc/hr for 12 hours -Magnesium sulfate 2 grams -Repeat BMET this afternoon and tomorrow am -Repeat magnesium level tomorrow am -Telemetry -Will need to be discharged on oral potassium replacement  Tobacco Abuse: Patient counseled on smoking cessation. 1/2 ppd smoker for 24 years. -Nicotine patch -Counseled on importance of smoking cessation  DVT/PE ppx: Heparin TID FEN: Clears CODE: FULL  Dispo: Admit patient to  Inpatient with expected length of stay greater than 2 midnights.  Signed: Martyn Malay, DO PGY-3 Internal Medicine Resident Pager # [phone] 07/15/2016 2:11 PM

## 2016-07-15 NOTE — ED Notes (Signed)
Pt called out requesting her nurse, upon this RN entering the room, pt stated she wanted to go home. Pt stating that she was starving and did not understand why she had to have a liquid diet, also stated she did not want to take steroids, this RN explained reason for liquid diet. This RN encouraged pt to stay so that she could receive needed treatment, also advised pt she would be leaving AMA if she did not stay. Pt agreed to stay for treatment.

## 2016-07-15 NOTE — Progress Notes (Signed)
Pharmacy Antibiotic Note  Julie [name] is a 41 y.o. female admitted on 07/15/2016 with intra-abdominal infectoin.  Pharmacy has been consulted for zosyn dosing.  Plan: Zosyn 3.375g IV q8h (4 hour infusion). F/u LOT and clinical progression Height: 5\' 2"  (157.5 cm) Weight: 172 lb (78 kg) IBW/kg (Calculated) : 50.1  Temp (24hrs), Avg:98.6 F (37 C), Min:98.6 F (37 C), Max:98.6 F (37 C)   Recent Labs Lab 07/15/16 0829  WBC 13.0*  CREATININE 0.86    Estimated Creatinine Clearance: 85 mL/min (by C-G formula based on SCr of 0.86 mg/dL).    Allergies  Allergen Reactions  . Sulfonamide Derivatives Nausea And Vomiting    Thank you for allowing pharmacy to be a part of this patient's care.  Toniann Fail Makisha Marrin 07/15/2016 2:12 PM

## 2016-07-15 NOTE — ED Notes (Signed)
Pt reports her pain has returned, Lemont Fillers, [place] made aware, gave this RN verbal order for 1mg  dialudid.

## 2016-07-16 ENCOUNTER — Telehealth: Payer: Self-pay

## 2016-07-16 DIAGNOSIS — Z882 Allergy status to sulfonamides status: Secondary | ICD-10-CM

## 2016-07-16 DIAGNOSIS — D649 Anemia, unspecified: Secondary | ICD-10-CM

## 2016-07-16 LAB — CBC
HCT: 28.3 % — ABNORMAL LOW (ref 36.0–46.0)
HEMATOCRIT: 26.6 % — AB (ref 36.0–46.0)
HEMOGLOBIN: 8.5 g/dL — AB (ref 12.0–15.0)
Hemoglobin: 9.2 g/dL — ABNORMAL LOW (ref 12.0–15.0)
MCH: 28 pg (ref 26.0–34.0)
MCH: 28.6 pg (ref 26.0–34.0)
MCHC: 32 g/dL (ref 30.0–36.0)
MCHC: 32.5 g/dL (ref 30.0–36.0)
MCV: 87.5 fL (ref 78.0–100.0)
MCV: 87.9 fL (ref 78.0–100.0)
PLATELETS: 389 10*3/uL (ref 150–400)
Platelets: 332 10*3/uL (ref 150–400)
RBC: 3.04 MIL/uL — AB (ref 3.87–5.11)
RBC: 3.22 MIL/uL — ABNORMAL LOW (ref 3.87–5.11)
RDW: 15.5 % (ref 11.5–15.5)
RDW: 15.2 % (ref 11.5–15.5)
WBC: 7.3 10*3/uL (ref 4.0–10.5)
WBC: 8.5 10*3/uL (ref 4.0–10.5)

## 2016-07-16 LAB — BASIC METABOLIC PANEL
Anion gap: 6 (ref 5–15)
BUN: <5 mg/dL — ABNORMAL LOW (ref 6–20)
CHLORIDE: 109 mmol/L (ref 101–111)
CO2: 22 mmol/L (ref 22–32)
Calcium: 6.9 mg/dL — ABNORMAL LOW (ref 8.9–10.3)
Creatinine, Ser: 0.67 mg/dL (ref 0.44–1.00)
GFR calc non Af Amer: >60 mL/min (ref >60)
Glucose, Bld: 92 mg/dL (ref 65–99)
POTASSIUM: 3.2 mmol/L — AB (ref 3.5–5.1)
Sodium: 137 mmol/L (ref 135–145)

## 2016-07-16 LAB — C-REACTIVE PROTEIN: CRP: 1.7 mg/dL — ABNORMAL HIGH (ref <1.0)

## 2016-07-16 LAB — PROTIME-INR
INR: 1.22
Prothrombin Time: 15.5 seconds — ABNORMAL HIGH (ref 11.4–15.2)

## 2016-07-16 LAB — HIV ANTIBODY (ROUTINE TESTING W REFLEX): HIV Screen 4th Generation wRfx: NONREACTIVE

## 2016-07-16 LAB — MAGNESIUM: MAGNESIUM: 1.8 mg/dL (ref 1.7–2.4)

## 2016-07-16 LAB — SEDIMENTATION RATE: SED RATE: 40 mm/h — AB (ref 0–22)

## 2016-07-16 MED ORDER — OXYCODONE-ACETAMINOPHEN 5-325 MG PO TABS: 1.0000 | ORAL_TABLET | ORAL | 0 refills | Status: DC | PRN

## 2016-07-16 MED ORDER — POTASSIUM CHLORIDE CRYS ER 20 MEQ PO TBCR
40.0000 meq | EXTENDED_RELEASE_TABLET | Freq: Once | ORAL | Status: AC
  Administered 2016-07-16: 40 meq via ORAL
  Filled 2016-07-16: qty 2

## 2016-07-16 MED ORDER — METRONIDAZOLE 500 MG PO TABS: 500.0000 mg | ORAL_TABLET | Freq: Three times a day (TID) | ORAL | 0 refills | Status: DC

## 2016-07-16 MED ORDER — BUDESONIDE 3 MG PO CPEP: 9.0000 mg | ORAL_CAPSULE | Freq: Every day | ORAL | 2 refills | Status: DC

## 2016-07-16 MED ORDER — MAGNESIUM SULFATE 2 GM/50ML IV SOLN
2.0000 g | Freq: Once | INTRAVENOUS | Status: AC
  Administered 2016-07-16: 2 g via INTRAVENOUS
  Filled 2016-07-16: qty 50

## 2016-07-16 MED ORDER — OXYCODONE-ACETAMINOPHEN 5-325 MG PO TABS
1.0000 | ORAL_TABLET | Freq: Four times a day (QID) | ORAL | Status: DC | PRN
Start: 2016-07-16 — End: 2016-07-16

## 2016-07-16 MED ORDER — CIPROFLOXACIN HCL 500 MG PO TABS
500.0000 mg | ORAL_TABLET | Freq: Two times a day (BID) | ORAL | Status: DC
  Administered 2016-07-16: 500 mg via ORAL
  Filled 2016-07-16: qty 1

## 2016-07-16 MED ORDER — OXYCODONE-ACETAMINOPHEN 5-325 MG PO TABS: 1.0000 | ORAL_TABLET | ORAL | Status: DC | PRN

## 2016-07-16 MED ORDER — SODIUM CHLORIDE 0.9 % IV SOLN
1.0000 g | Freq: Once | INTRAVENOUS | Status: AC
  Administered 2016-07-16: 1 g via INTRAVENOUS
  Filled 2016-07-16: qty 10

## 2016-07-16 MED ORDER — OXYCODONE-ACETAMINOPHEN 5-325 MG PO TABS
1.0000 | ORAL_TABLET | ORAL | Status: DC | PRN
  Administered 2016-07-16: 2 via ORAL
  Filled 2016-07-16: qty 2

## 2016-07-16 MED ORDER — POTASSIUM CHLORIDE ER 20 MEQ PO TBCR: 40.0000 meq | EXTENDED_RELEASE_TABLET | Freq: Every day | ORAL | 2 refills | Status: DC

## 2016-07-16 MED ORDER — CIPROFLOXACIN HCL 500 MG PO TABS: 500.0000 mg | ORAL_TABLET | Freq: Two times a day (BID) | ORAL | 0 refills | Status: DC

## 2016-07-16 MED ORDER — OXYCODONE-ACETAMINOPHEN 5-325 MG PO TABS
1.0000 | ORAL_TABLET | Freq: Four times a day (QID) | ORAL | Status: DC | PRN
  Administered 2016-07-16: 1 via ORAL
  Filled 2016-07-16: qty 1

## 2016-07-16 NOTE — Progress Notes (Signed)
[name] to be D/C'd Home per MD order.  Discussed with the patient and all questions fully answered.  VSS, Skin clean, dry and intact without evidence of skin break down, no evidence of skin tears noted. IV catheter discontinued intact. Site without signs and symptoms of complications. Dressing and pressure applied.  An After Visit Summary was printed and given to the patient. Patient received prescription.  D/c education completed with patient/family including follow up instructions, medication list, d/c activities limitations if indicated, with other d/c instructions as indicated by MD - patient able to verbalize understanding, all questions fully answered.   Patient instructed to return to ED, call 911, or call MD for any changes in condition.   Patient escorted via WC, and D/C home via private auto.  Julie [name] 07/16/2016 5:07 PM

## 2016-07-16 NOTE — Telephone Encounter (Signed)
Hospital TOC per Dr Lawerance Bach, discharge 07/16/2016, appt 07/23/2016.

## 2016-07-16 NOTE — Progress Notes (Signed)
Date: 07/16/2016  Patient name: NOLA BOTKINS  Medical record number: [id number]  Date of birth: May 26, 1975   I have seen and evaluated [name] and discussed their care with the Residency Team. Ms. Eunice is a 41 year old woman who presented with a chief complaint of abdominal pain. She was diagnosed with Crohn's disease in 2014 and had an ileocecectomy after presenting with a bowel obstruction. Since her diagnosis, she has been on several different medications but was not nearly side effects but was not able to be on the long-term due to side effects, ineffectiveness or lack of adequate trial, or noncompliance. She is followed by gastroenterology at [facility]. She was seen by her physician earlier this month and an MRE was scheduled to assess disease activity in her small intestine. During the appointment, she was strongly advised to stop smoking and avoid all nonsteroidals. She was also placed on a PPI for prophylaxis due to nonsteroidal use.  About a week ago she ran out of her budesonide and had increasing right lower abdominal pain along with bowel movements. She denies fever. CT on admission showed colitis and terminal ileitis with a fluid collection which was not loculated and not drainable. She was started on Cipro and metronidazole and her budesonide was resumed. She was started on a clear liquid diet.  This morning her pain is back to baseline except when she sits up or moves. Her stools are back to baseline. She denies any blood in her stools. She is requesting a solid diet as she is hungry.  PMHx, Fam Hx, and/or Soc Hx : Other past medical history includes tobacco abuse. Family history is significant for diabetes in her mother. 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 She does smoke 1 pack per day.   Vitals:   07/15/16 2146 07/16/16 0530  BP: (!) 91/52 (!) 91/49  Pulse: 80 72  Resp: 18 18  Temp: 98 F (36.7 C) 98.2 F (36.8 C)   Sitting up in bed in no acute  distress Heart regular rhythm without murmur Lungs clear to auscultation bilaterally Abdomen positive bowel sounds with tenderness to palpation on the right side Extremities no edema Skin dry skin with numerous excoriations from scratching and her puppy. Positive tattoos  The team and I independently reviewed her CT images and it was positive for bowel wall thickening along with luminal narrowing and a fluid collection in the right lower extremity  K 2.7 - 3.2 WBC 13 - 7.3 HgB 11.2 - 8.5 Stool negative C diff Sedimentation rate 40   Assessment and Plan: I have seen and evaluated the patient as outlined above. I agree with the formulated Assessment and Plan as detailed in the residents' note, with the following changes:   1. Acute flare of poorly controlled Crohn's disease - her Crohn's disease has never been adequately treated for various reasons. She ran out of her budesonide a week ago. She is negative for C. difficile. Treatment has been challenging as the patient does not want to take prednisone as it causes mania. Her GI team [facility] at [facility]. She has had multiple trials on Biologics which were not successful. She has responded quite well to budesonide and metronidazole and Cipro. The antibiotics were started for the fluid collection in her abdomen along with the slight benefit they may provide for her crohn's disease. Dr. Lawerance Bach reviewed her CT scan with radiology who confirmed that there is nothing drainable in the right lower  quadrant at this time. Since she has responded quite well to simply antibiotics and budesonide, her leukocytosis has resolved, she has never been febrile, and clinically she looks quite well we will advance her diet and anticipate discharge to home today or tomorrow with close follow-up with her gastroenterologist this week. She knows that smoking is harmful. We we will advance her diet to a regular diet.  Other medical issues per Dr. Juanell Fairly progress note    Burns Spain, MD 4/20/201811:54 AM

## 2016-07-16 NOTE — Discharge Summary (Signed)
Name: Julie [name] MRN: [id number] DOB: [birth year] 41 y.o. PCP: Pcp Not In System  Date of Admission: 07/15/2016  8:17 AM Date of Discharge: 07/16/2016 Attending Physician:  [place], MD  Discharge Diagnosis: Principal Problem:   Exacerbation of Crohn's disease [facility]) Active Problems:   Hypokalemia   Tobacco abuse   Anemia   Hypomagnesemia   Discharge Medications: Allergies as of 07/16/2016      Reactions   Sulfonamide Derivatives Nausea And Vomiting      Medication List    STOP taking these medications   ibuprofen 200 MG tablet Commonly known as:  ADVIL,MOTRIN   predniSONE 20 MG tablet Commonly known as:  DELTASONE   promethazine 12.5 MG tablet Commonly known as:  PHENERGAN     TAKE these medications   acetaminophen 500 MG tablet Commonly known as:  TYLENOL Take 1,500 mg by mouth every 6 (six) hours as needed for moderate pain or headache.   budesonide 3 MG 24 hr capsule Commonly known as:  ENTOCORT EC Take 3 capsules (9 mg total) by mouth daily. Start taking on:  07/17/2016   ciprofloxacin 500 MG tablet Commonly known as:  CIPRO Take 1 tablet (500 mg total) by mouth 2 (two) times daily.   cyanocobalamin 1000 MCG/ML injection Commonly known as:  (VITAMIN B-12) Inject 1,000 mcg into the muscle every 30 (thirty) days.   metroNIDAZOLE 500 MG tablet Commonly known as:  FLAGYL Take 1 tablet (500 mg total) by mouth every 8 (eight) hours.   ondansetron 4 MG disintegrating tablet Commonly known as:  ZOFRAN ODT Take 1 tablet (4 mg total) by mouth every 8 (eight) hours as needed for nausea or vomiting. What changed:  Another medication with the same name was removed. Continue taking this medication, and follow the directions you see here.   oxyCODONE-acetaminophen 5-325 MG tablet Commonly known as:  PERCOCET/ROXICET Take 1-2 tablets by mouth every 4 (four) hours as needed for severe pain. What changed:  how much to take  when to take this    Potassium Chloride ER 20 MEQ Tbcr Take 40 mEq by mouth daily.       Disposition and follow-up:   Julie [name] was discharged from [facility] in Stable condition.  At the hospital follow up visit please address:  1.  Crohn's Disease:  Please ensure patient went for her MRE (scheduled for MR enterography at [facility] on 07/22/2016), ensure patient is taking budesonide 9 mg daily, has completed cipro/flagyl (4/19 >> 4/25) for 7 day course. 2. Hypokalemia: Please recheck BMET. Check for compliance with KDur 40 mEq 3. Normocytic Anemia: hgb 11>8 during hospital stay, likely dilutional, please recheck on follow up 4. Follow up with gastroenterologist, Dr. Andrey Campanile at [facility]; attempted to get a follow-up appt within 2 weeks of discharge but was unsuccessful (next available appt was in June); passed along message to Dr. Andrey Campanile regarding whether he would want sooner follow-up after patient's MRE next week  9.  Labs / imaging needed at time of follow-up:  -CBC to f/u Hgb -BMET for potassium -Consider repeat abdominal imaging if abdominal pain worsens, fever, hemodynamically unstable, or to evaluate a possibly developing RLQ abscess seen on imaging during admission  10.  Pending labs/ test needing follow-up: N/A  Follow-up Appointments: Follow-up Information    [facility] Follow up on 07/23/2016.   Why:  Please follow-up with [place] on Friday, 07/23/2016, at 10:15am for a post-hospitalization appointment and check  your blood count. If you cannot make this appointment, please call the clinic at [phone] and make another appointment within 2 weeks. Contact information: [address] [place] [address] [phone]          Hospital Course by problem list: Principal Problem:   Exacerbation of Crohn's disease (HCC) Active Problems:   Hypokalemia   Tobacco abuse   Anemia   Hypomagnesemia   Julie [name] is a 41 F with PMHx of  poorly controlled Crohn's Disease (Diagnosed 2014, s/p ileocecectomy 2014 and failed multiple treatments including Humira, 6-MP, AZA, Remicade) and on-going tobacco abuse who presented to the emergency department with complaint of abdominal pain. Patient stated that the night prior to admission she developed onset of severe, right-sided, nonradiating sharp, stabbing abdominal pain. She described the pain like contractions as it came in waves. Movement made her pain worse. Pain medications made her pain better. Patient has chronic nausea, vomiting and diarrhea. At baseline, patient has 20-25 watery, large diarrhea bowel movements per day. They are normally yellow in color. She denied any worsening of her nausea, vomiting or diarrhea. She denied any hematemesis, melena or hematochezia. She denied fever, chills, chest pain, shortness of breath. However, she has noticed recent palpitations and lightheadedness when she stands. The only daily medications she was currently taking at home was Zofran and Phenergan. Per her most recent gastroenterology note, patient is supposed to be on Entocort (budesonide) 9 mg daily, but patient stated she ran out one week ago.  In regards to her Crohn's disease, patient was first diagnosed in April 2014 after undergoing ileocecectomy due to bowel obstruction. Since that time, patient has been on multiple different medications and has failed treatment either due to side effects, noncompliance or ineffectiveness. These medications include Humira, 6-MP, Remicade, Imuran. Patiently adamantly refuses to take prednisone or IV steroids given the side effect of mania. She does tolerate budesonide. Patient was admitted for a Crohn's flare in July 2017 and at that time treated with prednisone orally with a prednisone taper. However, patient was adamant that she will not take prednisone this admission. Patient was recently seen on 06/28/16 by her gastroenterologist, Dr. Andrey Campanile, at [facility].  Recommendations at that visit were to obtain MRE which is scheduled later this month to evaluate for fibrostenosis or other complication, consider starting humira, remicade, stelara or colorectal surgery. PPI was recommended given NSAID abuse. Patient was counseled to avoid NSAIDs and to stop smoking.   In the emergency department, patient was afebrile, blood pressure 114/71, heart rate 93, respiratory rate 18, satting 100% on room air. Laboratory workup was significant for a mild leukocytosis of 13, hemoglobin 11.2. Basic metabolic panel showed hypokalemia at 2.3. Hypomagnesemia at 1.4. Urinalysis was negative for signs of infection. Pregnancy test negative. Lipase normal. AST and ALTs normal. EKG was sinus rhythm with right axis deviation with borderline QTC prolongation. CT abdomen and pelvis showed evidence of colitis and terminal ileitis given colonic wall thickening and terminal ileum with inflammatory changes. There was also question of loculated fluid in the right lower quadrant that may reflect developing abscess. I personally reviewed these images and then reviewed the images again with radiology. Currently, there is no evidence of drainable abscess via IR or general surgery.   On admission, patient was restarted on budesonide 9 mg daily and managed with IV and oral pain meds and anti-emetics. She was started on IV cipro + PO metronidazole. We did not give prednisone per adamant patient request due to  past side effect of mania. Throughout her admission, her pain significantly improved. Hypokalemia and hypomagnesemia corrected with supplementation. Patient remained hemodynamically stable without pyrexia or vomiting. Diarrhea was at her baseline. Continued budesonide. Switched IV to PO cipro; continued PO metronidazole. Her Hgb did drop from 11.2 to 8.5 on morning of discharge. Patient did not have any bloody or melanotic stools. Asymptomatic without chest pain, shortness of breath, lightheadedness or  dizziness. No other obvious sources of bleeding seen on exam. INR within normal range. CBC checked afternoon of discharge was stable at 9.2. She was discharged on 07/16/2016 with [facility] with [facility]. We contacted patient's gastroenterology office, but there were no available appointments until June. Dr. Andrey Campanile was going to be made aware of patient's recent hospitalization and this note will be sent to him.   Discharge Vitals:   BP (!) 91/49 (BP Location: Left Arm)   Pulse 72   Temp 98.2 F (36.8 C) (Oral)   Resp 18   Ht 5\' 2"  (1.575 m)   Wt 172 lb 9.6 oz (78.3 kg)   SpO2 99%   BMI 31.57 kg/m   Pertinent Labs, Studies, and Procedures:  CBC    Component Value Date/Time   WBC 8.5 07/16/2016 1355   RBC 3.22 (L) 07/16/2016 1355   HGB 9.2 (L) 07/16/2016 1355   HCT 28.3 (L) 07/16/2016 1355   PLT 389 07/16/2016 1355   MCV 87.9 07/16/2016 1355   MCH 28.6 07/16/2016 1355   MCHC 32.5 07/16/2016 1355   RDW 15.2 07/16/2016 1355   LYMPHSABS 2.3 07/15/2012 1643   MONOABS 0.5 07/15/2012 1643   EOSABS 0.1 07/15/2012 1643   BASOSABS 0.0 07/15/2012 1643   BMET    Component Value Date/Time   NA 137 07/16/2016 0418   K 3.2 (L) 07/16/2016 0418   CL 109 07/16/2016 0418   CO2 22 07/16/2016 0418   GLUCOSE 92 07/16/2016 0418   BUN <5 (L) 07/16/2016 0418   CREATININE 0.67 07/16/2016 0418   CALCIUM 6.9 (L) 07/16/2016 0418   GFRNONAA >60 07/16/2016 0418   GFRAA >60 07/16/2016 0418    CT ABDOMEN AND PELVIS WITH CONTRAST: Colonic wall thickening most severe in the cecum and also terminal ilium with severe surrounding inflammatory changes and fluid. Relative thickening of the remainder of the colon without surrounding inflammatory changes. Findings are most consistent with colitis and terminal ileitis given the patient's history of Crohn's disease. Some of the fluid which appears loculated in the right lower quadrant may reflect developing abscess.  Signed: Karlene Lineman, DO PGY-3 Internal Medicine Resident Pager # [phone] 07/16/2016 2:37 PM

## 2016-07-16 NOTE — Discharge Instructions (Signed)
Julie [name], for your Crohn's flare, please take entocort (budesonide) 9 mg every day. Please complete your course of antibiotics with metronidazole and ciprofloxacin until Wednesday when you have completed the prescription. Please follow up with your gastroenterologist and in our [facility] located in the hospital and listed in this packet. You can take 1 or 2 tablets of oxycodone every 4 hours as needed for pain.   Colitis Colitis is inflammation of the colon. Colitis may last a short time (acute) or it may last a long time (chronic). What are the causes? This condition may be caused by:  Viruses.  Bacteria.  Reactions to medicine.  Certain autoimmune diseases, such as Crohn disease or ulcerative colitis. What are the signs or symptoms? Symptoms of this condition include:  Diarrhea.  Passing bloody or tarry stool.  Pain.  Fever.  Vomiting.  Tiredness (fatigue).  Weight loss.  Bloating.  Sudden increase in abdominal pain.  Having fewer bowel movements than usual. How is this diagnosed? This condition is diagnosed with a stool test or a blood test. You may also have other tests, including X-rays, a CT scan, or a colonoscopy. How is this treated? Treatment may include:  Resting the bowel. This involves not eating or drinking for a period of time.  Fluids that are given through an IV tube.  Medicine for pain and diarrhea.  Antibiotic medicines.  Cortisone medicines.  Surgery. Follow these instructions at home: Eating and drinking   Follow instructions from your health care provider about eating or drinking restrictions.  Drink enough fluid to keep your urine clear or pale yellow.  Work with a dietitian to determine which foods cause your condition to flare up.  Avoid foods that cause flare-ups.  Eat a well-balanced diet. Medicines   Take over-the-counter and prescription medicines only as told by your health care provider.  If you were  prescribed an antibiotic medicine, take it as told by your health care provider. Do not stop taking the antibiotic even if you start to feel better. General instructions   Keep all follow-up visits as told by your health care provider. This is important. Contact a health care provider if:  Your symptoms do not go away.  You develop new symptoms. Get help right away if:  You have a fever that does not go away with treatment.  You develop chills.  You have extreme weakness, fainting, or dehydration.  You have repeated vomiting.  You develop severe pain in your abdomen.  You pass bloody or tarry stool. This information is not intended to replace advice given to you by your health care provider. Make sure you discuss any questions you have with your health care provider. Document Released: 04/22/2004 Document Revised: 08/21/2015 Document Reviewed: 07/08/2014 Elsevier Interactive Patient Education  2017 [employer].

## 2016-07-16 NOTE — Progress Notes (Signed)
   Subjective: Patient was seen and examined this morning. She feels back to her baseline. She continues to have abdominal pain, but improved. She denies fever, chills, or change in her chronic nausea, vomiting or diarrhea. Patient has been tolerating a liquid diet without issues. She denies lightheadedness.  Objective: Vital signs in last 24 hours: Vitals:   07/15/16 1456 07/15/16 2146 07/16/16 0530 07/16/16 0557  BP: (!) 88/49 (!) 91/52 (!) 91/49   Pulse: 69 80 72   Resp: '16 18 18   '$ Temp: 98.4 F (36.9 C) 98 F (36.7 C) 98.2 F (36.8 C)   TempSrc: Oral Oral Oral   SpO2: 100% 96% 99%   Weight: 172 lb (78 kg)   172 lb 9.6 oz (78.3 kg)  Height: '5\' 2"'$  (1.575 m)      Physical Exam General: Vital signs reviewed.  Patient is in no acute distress and cooperative with exam.  Cardiovascular: RRR Pulmonary/Chest: Clear to auscultation bilaterally, no wheezes, rales, or rhonchi. Abdominal: Soft, mildly tender in right mid and lower quadrant, non-distended, BS +, no guarding present.  Extremities: No lower extremity edema bilaterally,  pulses symmetric and intact bilaterally.  Skin: Warm, dry and intact.   Assessment/Plan: Principal Problem:   Exacerbation of Crohn's disease ([place]) Active Problems:   Hypokalemia   Tobacco abuse   Anemia  Ms. Moorefield is a 89 F with PMHx of poorly controlled Crohn's Disease (Diagnosed 2014, s/p ileocecectomy 2014 and failed multiple treatments including Humira, 6-MP, AZA, Remicade) and on-going tobacco abuse who presents to the emergency department with complaint of abdominal pain.   Mild Exacerbation of Crohn's Disease: Patient did well overnight. Pain is improved, she tolerated a liquid diet. She denies change in her chronic nausea, vomiting and diarrhea. Leukocytosis resolved from 11 to 7.3 this morning. C. difficile PCR negative. ESR/CRP elevated at 40/1.7. Given her refusal of IV steroids or oral prednisone, patient is being managed with budesonide 9 mg  daily. Patient is currently on Ciprofloxacin and Flagyl for exacerbation of Crohn's Disease. We will transition pain medications to oral today and advance diet in anticipation for discharge. She will need close follow up with GI.  -Budesonide 9 mg daily -Zofran 4 mg every 8 hours when necessary, cautioned for QTc -Oxycodone 5 mg Q4H prn  -Regular diet -Ciprofloxacin/Flagyl 4/19 >>4/25 -May need repeat imaging to ensure no development of discrete abscess if abdominal pain worsens -Will need close follow-up with outpatient gastroenterology, Dr. Redmond Pulling: [phone]  Hypokalemia, Hypocalcemia, Hypomagnesemia: Potasssium 3.2 this morning and magnesium 1.8. Corrected calcium 8.7. Replacing.  -KDur 40 mEq BID -Magnesium sulfate 2 grams -Calcium gluconate -Telemetry -Will need to be discharged on oral potassium replacement, KDur 40 mEq daily  Normocytic Anemia: Hgb this morning 8.5, decreased from 11.2 yesterday. Baseline appears to 10-11. Likely dilutional given 3L of NS received yesterday. No reports or evidence of blood loss. -Repeat CBC this afternoon. If hemoglobin is stable, would d/c home  Tobacco Abuse: Patient counseled on smoking cessation. 1/2 ppd smoker for 24 years. -Nicotine patch -Counseled on importance of smoking cessation  DVT/PE ppx: Heparin TID FEN: Clears CODE: FULL  Dispo: Anticipated discharge in approximately 0 day(s).   LOS: 1 day   Martyn Malay, DO PGY-3 Internal Medicine Resident Pager # [phone] 07/16/2016 10:46 AM

## 2016-07-16 NOTE — Care Management Note (Signed)
Case Management Note  Patient Details  Name: Julie [name] MRN: [id number] Date of Birth: 06-Mar-1976  Subjective/Objective:                 Admitted crohn's flare.   Action/Plan:  DC to home self care.  Expected Discharge Date:  07/16/16               Expected Discharge Plan:  Home/Self Care  In-House Referral:     Discharge planning Services  CM Consult  Post Acute Care Choice:    Choice offered to:     DME Arranged:    DME Agency:     HH Arranged:    HH Agency:     Status of Service:  Completed, signed off  If discussed at [employer] of Stay Meetings, dates discussed:    Additional Comments:  Lawerance Sabal, RN 07/16/2016, 5:26 PM

## 2016-07-21 DIAGNOSIS — K50818 Crohn's disease of both small and large intestine with other complication: Secondary | ICD-10-CM | POA: Insufficient documentation

## 2016-07-22 ENCOUNTER — Telehealth: Payer: Self-pay

## 2016-07-22 NOTE — Telephone Encounter (Signed)
APT. REMINDER CALL, LMTCB °

## 2016-07-23 ENCOUNTER — Ambulatory Visit: Payer: Medicaid Other

## 2016-08-13 NOTE — Telephone Encounter (Signed)
No answer multiple times

## 2016-12-13 DIAGNOSIS — Z932 Ileostomy status: Secondary | ICD-10-CM | POA: Insufficient documentation

## 2017-06-28 IMAGING — CT CT ABD-PELV W/ CM
2 of 4 series · 17 of 46 positions shown, 19 images · IV contrast (Omni 300)
Comparison: 09/30/2015

CLINICAL DATA: Mid abdominal pain, flank pain since yesterday

EXAM:
CT ABDOMEN AND PELVIS WITH CONTRAST
TECHNIQUE: Multidetector CT imaging of the abdomen and pelvis was performed
using the standard protocol following bolus administration of
intravenous contrast.
CONTRAST:  1 16347A-NYY IOPAMIDOL (16347A-NYY) INJECTION 61%

[Series 3: a/p w/ 5mm · axial · 0.76mm/px · z∈[+814,+1249]mm · 14 of 95 slices shown, 16 images]
[im 4/95  soft-tissue]
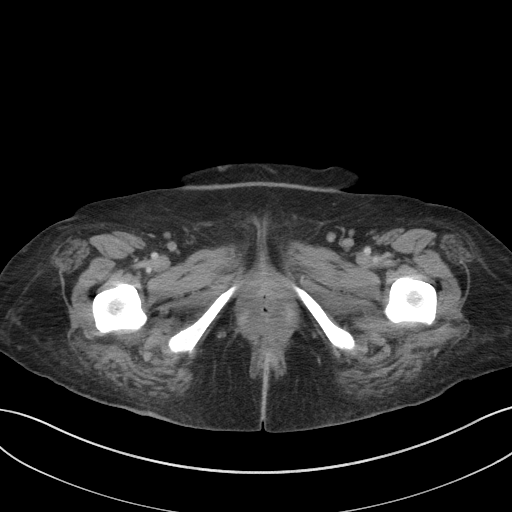
[im 4/95  bone]
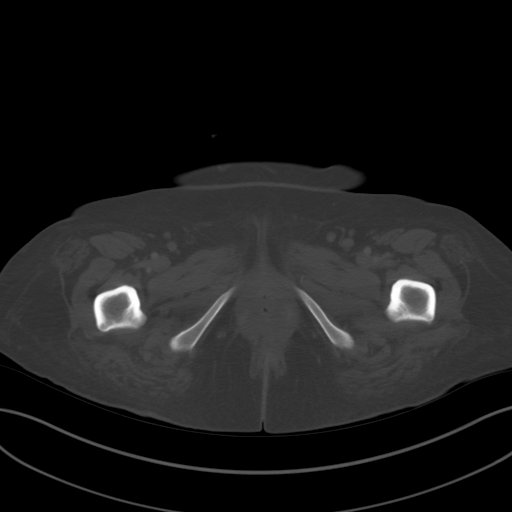
[im 12/95  soft-tissue]
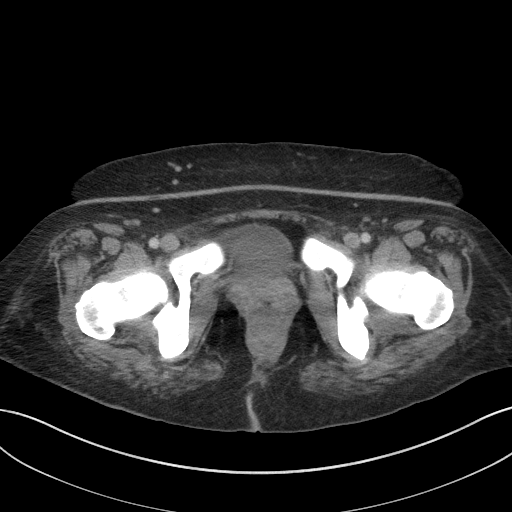
[im 20/95  soft-tissue]
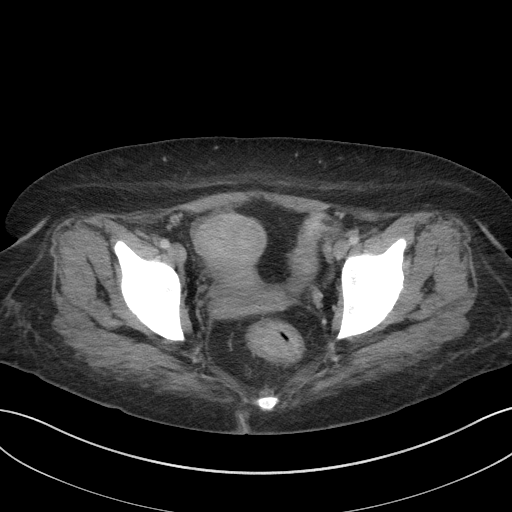
[im 24/95  soft-tissue]
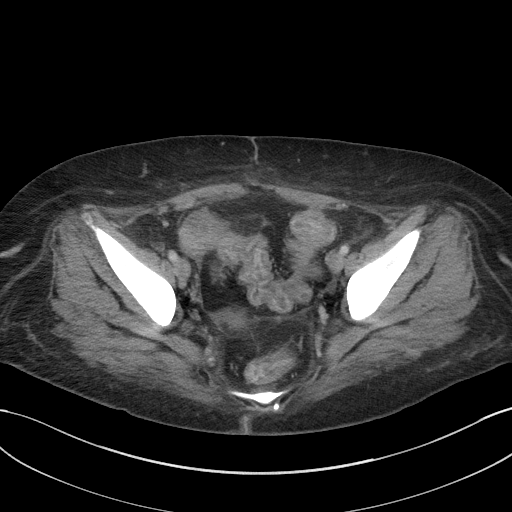
[im 32/95  soft-tissue]
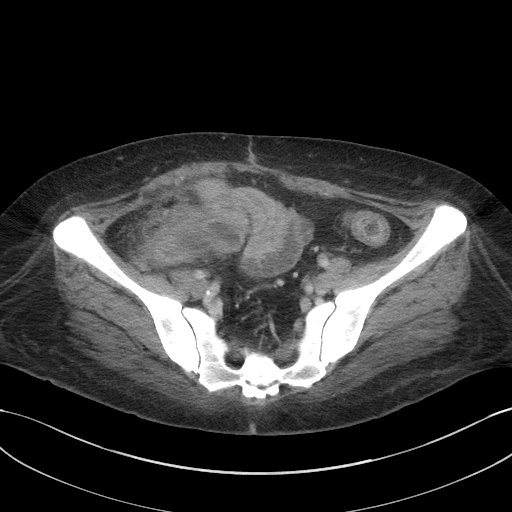
[im 40/95  soft-tissue]
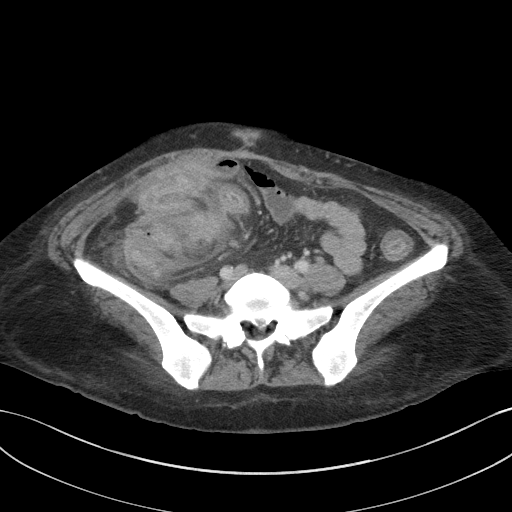
[im 44/95  soft-tissue]
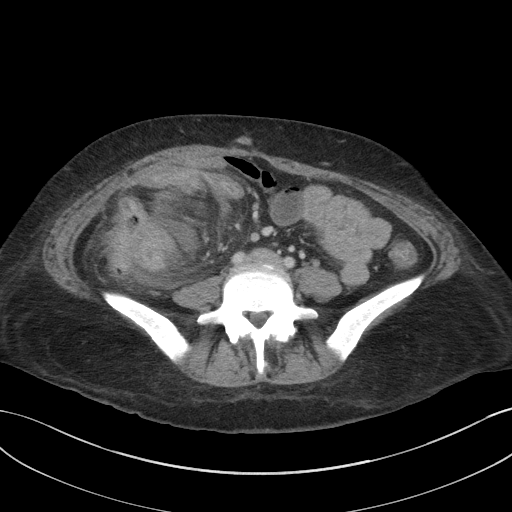
[im 51/95  soft-tissue]
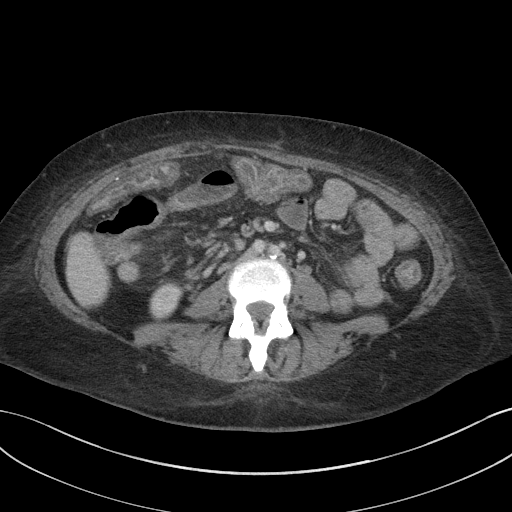
[im 55/95  soft-tissue]
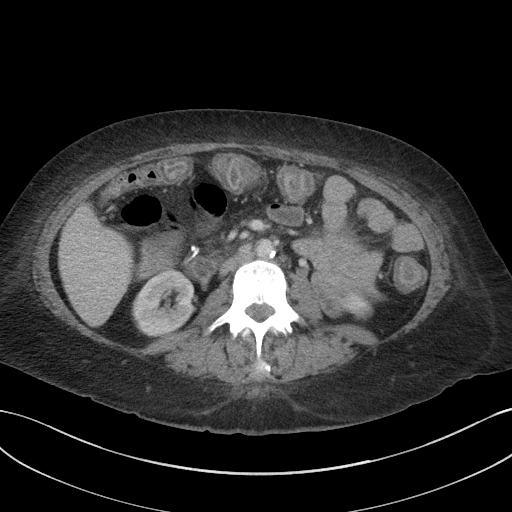
[im 55/95  bone]
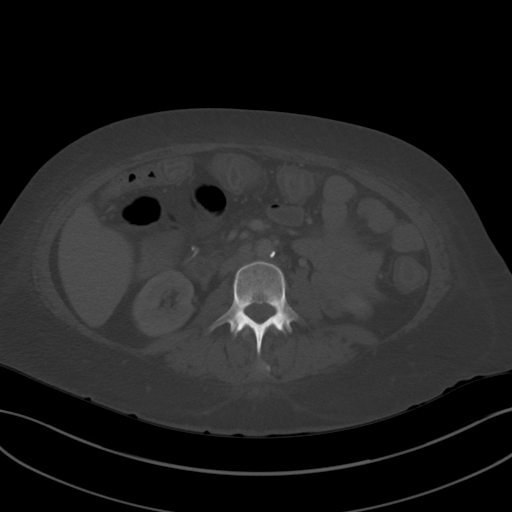
[im 63/95  soft-tissue]
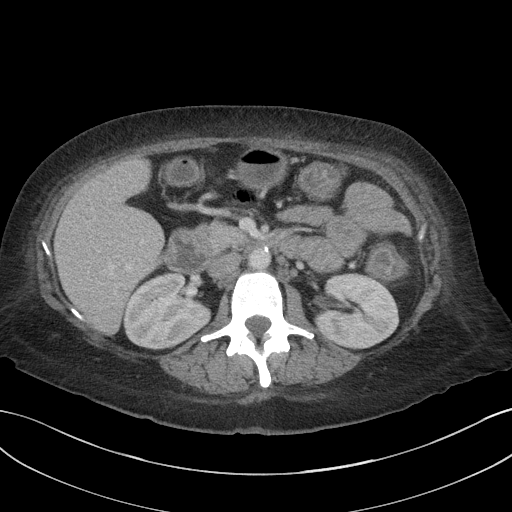
[im 71/95  soft-tissue]
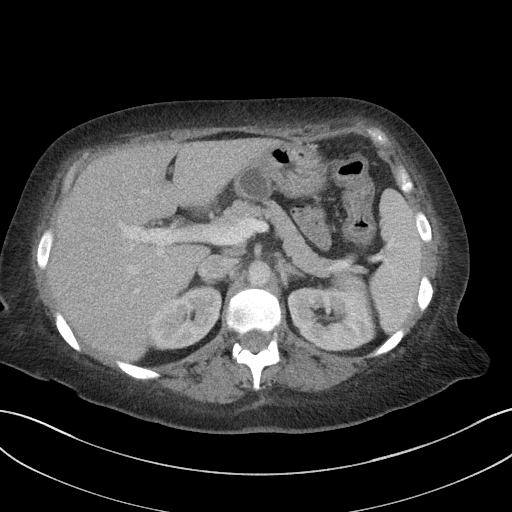
[im 75/95  soft-tissue]
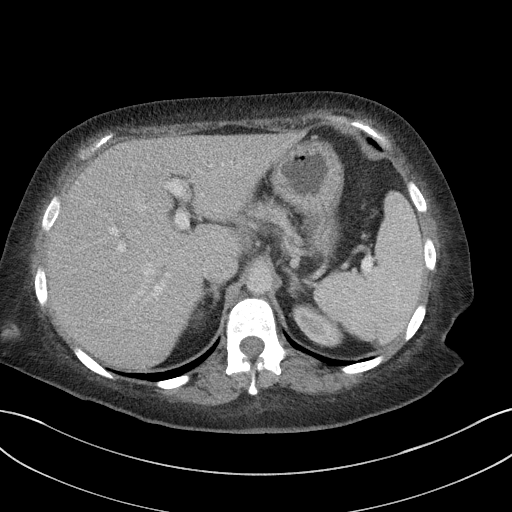
[im 83/95  soft-tissue]
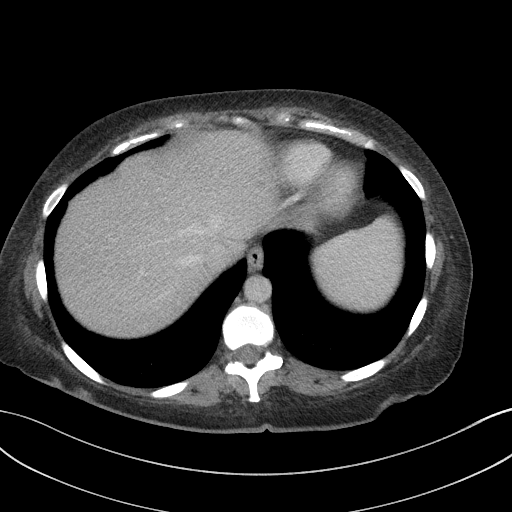
[im 91/95  soft-tissue]
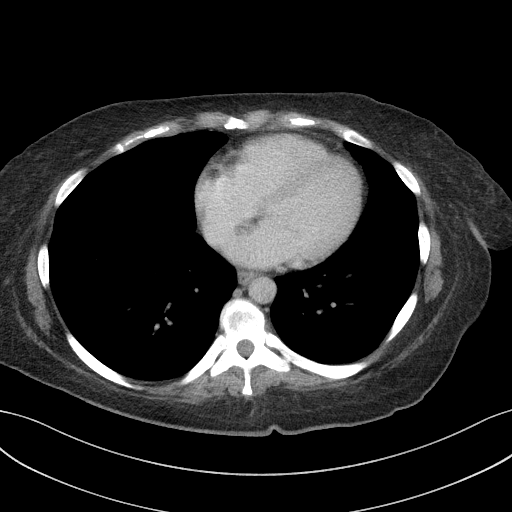

[Series 6: a/p w/ cor · coronal · 0.91mm/px · 3 of 149 slices shown]
[im 50/149  soft-tissue]
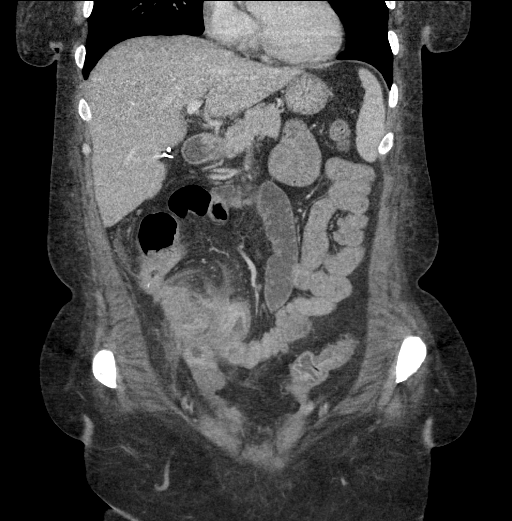
[im 66/149  soft-tissue]
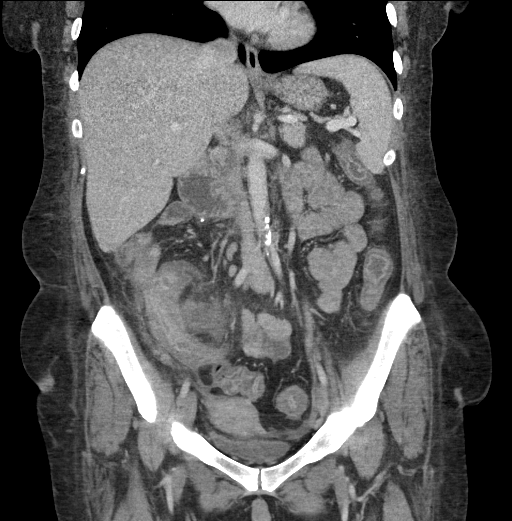
[im 83/149  soft-tissue]
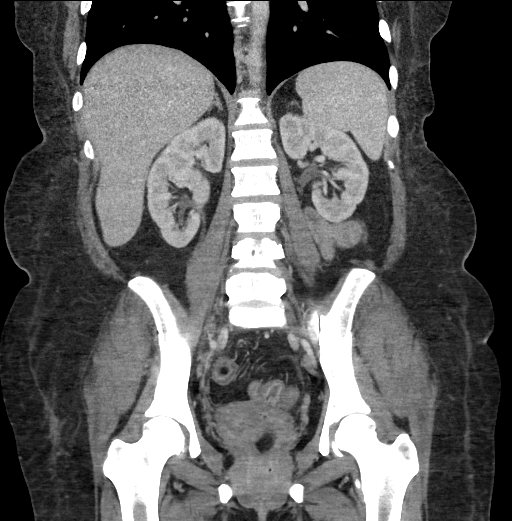

[17 of 46 positions shown; findings below may reference images not displayed]

FINDINGS: Lower chest: No acute abnormality.

Hepatobiliary: No focal liver abnormality is seen. Status post
cholecystectomy. No biliary dilatation.

Pancreas: Unremarkable. No pancreatic ductal dilatation or
surrounding inflammatory changes.

Spleen: Normal in size without focal abnormality.

Adrenals/Urinary Tract: Adrenal glands are unremarkable. Kidneys are
normal, without renal calculi, focal lesion, or hydronephrosis.
Bladder is unremarkable.

Stomach/Bowel: Stomach is within normal limits. Colonic wall
thickening most severe in the cecum and also terminal ilium with
surrounding inflammatory changes and fluid. Some of the fluid
appears loculated. No pneumatosis or pneumoperitoneum.

Vascular/Lymphatic: No significant vascular findings are present.
Mild abdominal aortic atherosclerosis. No enlarged abdominal or
pelvic lymph nodes.

Reproductive: Uterus and bilateral adnexa are unremarkable.

Other: No abdominal wall hernia or abnormality. No abdominopelvic
ascites.

Musculoskeletal:  Broad left paracentral disc protrusion at L5-S1.
IMPRESSION: 1. Colonic wall thickening most severe in the cecum and also
terminal ilium with severe surrounding inflammatory changes and
fluid. Relative thickening of the remainder of the colon without
surrounding inflammatory changes. Findings are most consistent with
colitis and terminal ileitis given the patient's history of Crohn's
disease. Some of the fluid which appears loculated in the right
lower quadrant may reflect developing abscess.

## 2018-06-07 DIAGNOSIS — E538 Deficiency of other specified B group vitamins: Secondary | ICD-10-CM | POA: Insufficient documentation

## 2019-01-11 ENCOUNTER — Ambulatory Visit: Payer: Self-pay | Admitting: Orthopaedic Surgery

## 2019-02-05 ENCOUNTER — Other Ambulatory Visit: Payer: Self-pay

## 2019-02-05 ENCOUNTER — Ambulatory Visit
Admission: EM | Admit: 2019-02-05 | Discharge: 2019-02-05 | Disposition: A | Discharge status: Home / Self Care | Payer: Medicaid Other

## 2019-02-05 DIAGNOSIS — Z20828 Contact with and (suspected) exposure to other viral communicable diseases: Secondary | ICD-10-CM | POA: Diagnosis not present

## 2019-02-05 DIAGNOSIS — R6889 Other general symptoms and signs: Secondary | ICD-10-CM

## 2019-02-05 DIAGNOSIS — Z20822 Contact with and (suspected) exposure to covid-19: Secondary | ICD-10-CM

## 2019-02-05 LAB — POCT INFLUENZA A/B
Influenza A, POC: NEGATIVE
Influenza B, POC: NEGATIVE

## 2019-02-05 MED ORDER — OSELTAMIVIR PHOSPHATE 75 MG PO CAPS: 75.0000 mg | ORAL_CAPSULE | Freq: Two times a day (BID) | ORAL | 0 refills | Status: DC

## 2019-02-05 NOTE — ED Triage Notes (Signed)
Pt presents with c/o flu symptoms that began last week, pt states fever at home is 102.9 but comes down with tylenol. Pt took tylenol at 10:00

## 2019-02-05 NOTE — ED Provider Notes (Signed)
[facility]   [id number] 02/05/19 Arrival Time: 1159   CC: flu-like symptoms  SUBJECTIVE: History from: patient.  JAMISEN HAWES is a 43 y.o. female who presents with fever, tmax of 103, body aches, chills, nausea, runny nose, congestion, and sore throat x 3 days.  Denies sick exposure to COVID, flu or strep.  Denies recent travel.  Has tried ibuprofen, tylenol, and other OTC medications with temporary relief.  Denies aggravating factors.  Denies previous symptoms in the past.   Denies cough, SOB, wheezing, chest pain, changes in bowel or bladder habits.    ROS: As per HPI.  All other pertinent ROS negative.     Past Medical History:  Diagnosis Date  . Acid reflux   . B12 deficiency 11/2014   initiated on IM B12 11/2014  . Chronic abdominal pain 2014  . Crohn disease (HCC) 2014   ileal and colonic dz.  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 10/2014 colonoscopy: ileal anastomotic stricture and colitis.  Remicade initiated 11/2014.  normal TPMT activity (18.3) 11/2014.  . Obesity 2011   314# in 2011   Past Surgical History:  Procedure Laterality Date  . CESAREAN SECTION  2003   vaginal delivery 2000  . COLON SURGERY  2014   terminal ileum and right colon resection  . COLONOSCOPY  10/31/14   + active Crohns severe colitis and anastomotic stricture in ileum   . ESOPHAGOGASTRODUODENOSCOPY  10/2014   normal  . LAPAROSCOPIC CHOLECYSTECTOMY  2010   Allergies  Allergen Reactions  . Sulfonamide Derivatives Nausea And Vomiting   No current facility-administered medications on file prior to encounter.    Current Outpatient Medications on File Prior to Encounter  Medication Sig Dispense Refill  . acetaminophen (TYLENOL) 500 MG tablet Take 1,500 mg by mouth every 6 (six) hours as needed for moderate pain or headache.    . STELARA 90 MG/ML SOSY injection     . [DISCONTINUED] Potassium Chloride ER 20 MEQ TBCR Take 40 mEq by mouth daily. 60 tablet 2   Social History    Socioeconomic History  . Marital status: Single    Spouse name: Not on file  . Number of children: Not on file  . Years of education: Not on file  . Highest education level: Not on file  Occupational History  . Not on file  Social Needs  . Financial resource strain: Not on file  . Food insecurity    Worry: Not on file    Inability: Not on file  . Transportation needs    Medical: Not on file    Non-medical: Not on file  Tobacco Use  . Smoking status: Current Every Day Smoker    Packs/day: 1.00  . Smokeless tobacco: Never Used  Substance and Sexual Activity  . Alcohol use: No  . Drug use: No  . Sexual activity: Yes  Lifestyle  . Physical activity    Days per week: Not on file    Minutes per session: Not on file  . Stress: Not on file  Relationships  . Social Musician on phone: Not on file    Gets together: Not on file    Attends religious service: Not on file    Active member of club or organization: Not on file    Attends meetings of clubs or organizations: Not on file    Relationship status: Not on file  . Intimate partner violence    Fear of current or ex partner: Not  on file    Emotionally abused: Not on file    Physically abused: Not on file    Forced sexual activity: Not on file  Other Topics Concern  . Not on file  Social History Narrative  . Not on file   Family History  Problem Relation Age of Onset  . Diabetes Mellitus II Mother   . Hypertension Father   . Kidney cancer Maternal Grandmother     OBJECTIVE:  Vitals:   02/05/19 1214  BP: (!) 142/84  Pulse: 88  Resp: 17  Temp: 98.6 F (37 C)  TempSrc: Oral  SpO2: 96%     General appearance: alert; appears mildly fatigued, but nontoxic; speaking in full sentences and tolerating own secretions HEENT: NCAT; Ears: EACs clear, TMs pearly gray; Eyes: PERRL.  EOM grossly intact. Nose: nares patent without rhinorrhea, Throat: oropharynx clear, tonsils non erythematous or enlarged, uvula  midline  Neck: supple without LAD Lungs: unlabored respirations, symmetrical air entry; cough: absent; no respiratory distress; CTAB Heart: regular rate and rhythm.   Abdomen: soft, nondistended, normal active bowel sounds; nontender to palpation; no guarding  Skin: warm and dry Psychological: alert and cooperative; normal mood and affect  ASSESSMENT & PLAN:  1. Suspected COVID-19 virus infection   2. Flu-like symptoms     Meds ordered this encounter  Medications  . oseltamivir (TAMIFLU) 75 MG capsule    Sig: Take 1 capsule (75 mg total) by mouth every 12 (twelve) hours.    Dispense:  10 capsule    Refill:  0    Order Specific Question:   Supervising Provider    Answer:   Raylene Everts [[id number]]   Flu test was negative, however, based on symptoms I will go ahead and treat you for flu COVID testing ordered.  It will take between 5-7 days for test results.  Someone will contact you regarding abnormal results.    In the meantime: You should remain isolated in your home for 10 days from symptom onset AND greater than 72 hours after symptoms resolution (absence of fever without the use of fever-reducing medication and improvement in respiratory symptoms), whichever is longer Get plenty of rest and push fluids You may use OTC zyrtec as needed for nasal congestion, runny nose, and/or sore throat You may use OTC flonase as needed for nasal congestion and runny nose Use medications daily for symptom relief Use OTC medications like ibuprofen or tylenol as needed fever or pain Call or go to the ED if you have any new or worsening symptoms such as fever, worsening cough, shortness of breath, chest tightness, chest pain, turning blue, changes in mental status, etc...   Reviewed expectations re: course of current medical issues. Questions answered. Outlined signs and symptoms indicating need for more acute intervention. Patient verbalized understanding. After Visit Summary given.          Lestine Box, PA-C 02/05/19 1308

## 2019-02-05 NOTE — Discharge Instructions (Signed)
Flu test was negative, however, based on symptoms I will go ahead and treat you for flu COVID testing ordered.  It will take between 5-7 days for test results.  Someone will contact you regarding abnormal results.    In the meantime: You should remain isolated in your home for 10 days from symptom onset AND greater than 72 hours after symptoms resolution (absence of fever without the use of fever-reducing medication and improvement in respiratory symptoms), whichever is longer Get plenty of rest and push fluids You may use OTC zyrtec as needed for nasal congestion, runny nose, and/or sore throat You may use OTC flonase as needed for nasal congestion and runny nose Use medications daily for symptom relief Use OTC medications like ibuprofen or tylenol as needed fever or pain Call or go to the ED if you have any new or worsening symptoms such as fever, worsening cough, shortness of breath, chest tightness, chest pain, turning blue, changes in mental status, etc..Marland Kitchen

## 2019-02-06 LAB — NOVEL CORONAVIRUS, NAA: SARS-CoV-2, NAA: NOT DETECTED

## 2019-04-17 ENCOUNTER — Ambulatory Visit
Admission: EM | Admit: 2019-04-17 | Discharge: 2019-04-17 | Disposition: A | Discharge status: Home / Self Care | Payer: Medicaid Other | Attending: Emergency Medicine | Admitting: Emergency Medicine

## 2019-04-17 ENCOUNTER — Other Ambulatory Visit: Payer: Self-pay

## 2019-04-17 DIAGNOSIS — Z20822 Contact with and (suspected) exposure to covid-19: Secondary | ICD-10-CM

## 2019-04-17 MED ORDER — FLUTICASONE PROPIONATE 50 MCG/ACT NA SUSP: 2.0000 | Freq: Every day | NASAL | 0 refills | Status: AC

## 2019-04-17 MED ORDER — CETIRIZINE HCL 10 MG PO TABS: 10.0000 mg | ORAL_TABLET | Freq: Every day | ORAL | 0 refills | Status: AC

## 2019-04-17 NOTE — ED Triage Notes (Signed)
Pt had positive exposure on Friday and developed runny nose and fatigue yesterday

## 2019-04-17 NOTE — ED Provider Notes (Signed)
[facility]   [id number] 04/17/19 Arrival Time: 0856   CC: COVID symptoms  SUBJECTIVE: History from: patient.  Julie [name] is a 44 y.o. female who presents with runny nose, sore throat, mild cough, and fatigue x 1 day.  COVID positive exposure 4 days ago.  Denies recent travel.  Has NOT tried OTC medications.  Denies aggravating factors.  Reports previous symptoms in the past related to a cold.   Denies fever, chills, sinus pain, SOB, wheezing, chest pain, changes in bladder habits.    Does mention intermittent nausea, vomiting, and diarrhea, but attributes this to her Crohns disease.    ROS: As per HPI.  All other pertinent ROS negative.     Past Medical History:  Diagnosis Date  . Acid reflux   . B12 deficiency 11/2014   initiated on IM B12 11/2014  . Chronic abdominal pain 2014  . Crohn disease (HCC) 2014   ileal and colonic dz.  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 10/2014 colonoscopy: ileal anastomotic stricture and colitis.  Remicade initiated 11/2014.  normal TPMT activity (18.3) 11/2014.  . Obesity 2011   314# in 2011   Past Surgical History:  Procedure Laterality Date  . CESAREAN SECTION  2003   vaginal delivery 2000  . COLON SURGERY  2014   terminal ileum and right colon resection  . COLONOSCOPY  10/31/14   + active Crohns severe colitis and anastomotic stricture in ileum   . ESOPHAGOGASTRODUODENOSCOPY  10/2014   normal  . LAPAROSCOPIC CHOLECYSTECTOMY  2010   Allergies  Allergen Reactions  . Sulfonamide Derivatives Nausea And Vomiting   No current facility-administered medications on file prior to encounter.   Current Outpatient Medications on File Prior to Encounter  Medication Sig Dispense Refill  . acetaminophen (TYLENOL) 500 MG tablet Take 1,500 mg by mouth every 6 (six) hours as needed for moderate pain or headache.    . STELARA 90 MG/ML SOSY injection     . [DISCONTINUED] Potassium Chloride ER 20 MEQ TBCR Take 40 mEq by mouth daily. 60  tablet 2   Social History   Socioeconomic History  . Marital status: Single    Spouse name: Not on file  . Number of children: Not on file  . Years of education: Not on file  . Highest education level: Not on file  Occupational History  . Not on file  Tobacco Use  . Smoking status: Current Every Day Smoker    Packs/day: 1.00  . Smokeless tobacco: Never Used  Substance and Sexual Activity  . Alcohol use: No  . Drug use: No  . Sexual activity: Yes  Other Topics Concern  . Not on file  Social History Narrative  . Not on file   Social Determinants of Health   Financial Resource Strain:   . Difficulty of Paying Living Expenses: Not on file  Food Insecurity:   . Worried About Programme researcher, broadcasting/film/video in the Last Year: Not on file  . Ran Out of Food in the Last Year: Not on file  Transportation Needs:   . Lack of Transportation (Medical): Not on file  . Lack of Transportation (Non-Medical): Not on file  Physical Activity:   . Days of Exercise per Week: Not on file  . Minutes of Exercise per Session: Not on file  Stress:   . Feeling of Stress : Not on file  Social Connections:   . Frequency of Communication with Friends and Family: Not on file  . Frequency  of Social Gatherings with Friends and Family: Not on file  . Attends Religious Services: Not on file  . Active Member of Clubs or Organizations: Not on file  . Attends Archivist Meetings: Not on file  . Marital Status: Not on file  Intimate Partner Violence:   . Fear of Current or Ex-Partner: Not on file  . Emotionally Abused: Not on file  . Physically Abused: Not on file  . Sexually Abused: Not on file   Family History  Problem Relation Age of Onset  . Diabetes Mellitus II Mother   . Hypertension Father   . Kidney cancer Maternal Grandmother     OBJECTIVE:  Vitals:   04/17/19 0919  BP: 101/70  Pulse: 85  Resp: 18  Temp: 99.1 F (37.3 C)  SpO2: 95%     General appearance: alert; appears mildly  fatigued, but nontoxic; speaking in full sentences and tolerating own secretions HEENT: NCAT; Ears: EACs clear, TMs pearly gray; Eyes: PERRL.  EOM grossly intact. Nose: nares patent without rhinorrhea, Throat: oropharynx clear, tonsils non erythematous or enlarged, uvula midline  Neck: supple without LAD Lungs: unlabored respirations, symmetrical air entry; cough: mild; no respiratory distress; decreased breath sounds throughout Heart: regular rate and rhythm.   Skin: warm and dry Psychological: alert and cooperative; normal mood and affect  ASSESSMENT & PLAN:  1. Suspected COVID-19 virus infection   2. Exposure to COVID-19 virus     Meds ordered this encounter  Medications  . cetirizine (ZYRTEC) 10 MG tablet    Sig: Take 1 tablet (10 mg total) by mouth daily.    Dispense:  30 tablet    Refill:  0    Order Specific Question:   Supervising Provider    Answer:   Raylene Everts [[id number]]  . fluticasone (FLONASE) 50 MCG/ACT nasal spray    Sig: Place 2 sprays into both nostrils daily.    Dispense:  16 g    Refill:  0    Order Specific Question:   Supervising Provider    Answer:   Raylene Everts [[id number]]   COVID testing ordered.  It will take between 5-7 days for test results.  Someone will contact you regarding abnormal results.    In the meantime: You should remain isolated in your home for 10 days from symptom onset AND greater than 72 hours after symptoms resolution (absence of fever without the use of fever-reducing medication and improvement in respiratory symptoms), whichever is longer Get plenty of rest and push fluids Use OTC zyrtec for nasal congestion, runny nose, and/or sore throat Use OTC flonase for nasal congestion and runny nose Use medications daily for symptom relief Use OTC medications like ibuprofen or tylenol as needed fever or pain Follow up with PCP regarding test results Call or go to the ED if you have any new or worsening symptoms such as fever,  cough, shortness of breath, chest tightness, chest pain, turning blue, changes in mental status, etc...   Reviewed expectations re: course of current medical issues. Questions answered. Outlined signs and symptoms indicating need for more acute intervention. Patient verbalized understanding. After Visit Summary given.         Lestine Box, PA-C 04/17/19 [phone]

## 2019-04-17 NOTE — Discharge Instructions (Signed)
COVID testing ordered.  It will take between 5-7 days for test results.  Someone will contact you regarding abnormal results.    In the meantime: You should remain isolated in your home for 10 days from symptom onset AND greater than 72 hours after symptoms resolution (absence of fever without the use of fever-reducing medication and improvement in respiratory symptoms), whichever is longer Get plenty of rest and push fluids Use OTC zyrtec for nasal congestion, runny nose, and/or sore throat Use OTC flonase for nasal congestion and runny nose Use medications daily for symptom relief Use OTC medications like ibuprofen or tylenol as needed fever or pain Follow up with PCP regarding test results Call or go to the ED if you have any new or worsening symptoms such as fever, cough, shortness of breath, chest tightness, chest pain, turning blue, changes in mental status, etc...  

## 2019-04-18 LAB — NOVEL CORONAVIRUS, NAA: SARS-CoV-2, NAA: NOT DETECTED

## 2019-05-01 ENCOUNTER — Other Ambulatory Visit: Payer: Self-pay

## 2019-05-01 ENCOUNTER — Ambulatory Visit
Admission: EM | Admit: 2019-05-01 | Discharge: 2019-05-01 | Disposition: A | Discharge status: Home / Self Care | Payer: Medicaid Other | Attending: Emergency Medicine | Admitting: Emergency Medicine

## 2019-05-01 ENCOUNTER — Ambulatory Visit (INDEPENDENT_AMBULATORY_CARE_PROVIDER_SITE_OTHER): Payer: Medicaid Other

## 2019-05-01 DIAGNOSIS — M25531 Pain in right wrist: Secondary | ICD-10-CM

## 2019-05-01 DIAGNOSIS — M7989 Other specified soft tissue disorders: Secondary | ICD-10-CM

## 2019-05-01 DIAGNOSIS — M79641 Pain in right hand: Secondary | ICD-10-CM

## 2019-05-01 DIAGNOSIS — M109 Gout, unspecified: Secondary | ICD-10-CM

## 2019-05-01 MED ORDER — METHYLPREDNISOLONE SODIUM SUCC 125 MG IJ SOLR
125.0000 mg | Freq: Once | INTRAMUSCULAR | Status: AC
  Administered 2019-05-01: 09:00:00 125 mg via INTRAMUSCULAR

## 2019-05-01 MED ORDER — COLCHICINE 0.6 MG PO TABS: ORAL_TABLET | ORAL | 0 refills | Status: DC

## 2019-05-01 NOTE — ED Triage Notes (Signed)
Pt presents with right wrist pain that developed upon waking on Sunday , pt has nodules under skin that is hard and some swelling noted

## 2019-05-01 NOTE — ED Provider Notes (Signed)
[facility]   [id number] 05/01/19 Arrival Time: 7902  CC: RT wrist PAIN  SUBJECTIVE: History from: patient. Julie [name] is a 44 y.o. female complains of RT wrist pain that began 2 days ago.  Denies a precipitating event or specific injury.  Localizes the pain to the LT thumb and inside of wrist.  Describes the pain as constant and throbbing in character.  Has tried OTC medications without relief.  Symptoms are made worse with wrist ROM.  Denies similar symptoms in the past.  Complains of associated swelling.  Denies fever, chills, erythema, ecchymosis, effusion, weakness, numbness and tingling.     ROS: As per HPI.  All other pertinent ROS negative.     Past Medical History:  Diagnosis Date  . Acid reflux   . B12 deficiency 11/2014   initiated on IM B12 11/2014  . Chronic abdominal pain 2014  . Crohn disease (HCC) 2014   ileal and colonic dz.  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 10/2014 colonoscopy: ileal anastomotic stricture and colitis.  Remicade initiated 11/2014.  normal TPMT activity (18.3) 11/2014.  . Obesity 2011   314# in 2011   Past Surgical History:  Procedure Laterality Date  . CESAREAN SECTION  2003   vaginal delivery 2000  . COLON SURGERY  2014   terminal ileum and right colon resection  . COLONOSCOPY  10/31/14   + active Crohns severe colitis and anastomotic stricture in ileum   . ESOPHAGOGASTRODUODENOSCOPY  10/2014   normal  . LAPAROSCOPIC CHOLECYSTECTOMY  2010   Allergies  Allergen Reactions  . Sulfonamide Derivatives Nausea And Vomiting   No current facility-administered medications on file prior to encounter.   Current Outpatient Medications on File Prior to Encounter  Medication Sig Dispense Refill  . acetaminophen (TYLENOL) 500 MG tablet Take 1,500 mg by mouth every 6 (six) hours as needed for moderate pain or headache.    . cetirizine (ZYRTEC) 10 MG tablet Take 1 tablet (10 mg total) by mouth daily. 30 tablet 0  . fluticasone (FLONASE)  50 MCG/ACT nasal spray Place 2 sprays into both nostrils daily. 16 g 0  . STELARA 90 MG/ML SOSY injection     . [DISCONTINUED] Potassium Chloride ER 20 MEQ TBCR Take 40 mEq by mouth daily. 60 tablet 2   Social History   Socioeconomic History  . Marital status: Single    Spouse name: Not on file  . Number of children: Not on file  . Years of education: Not on file  . Highest education level: Not on file  Occupational History  . Not on file  Tobacco Use  . Smoking status: Current Every Day Smoker    Packs/day: 1.00  . Smokeless tobacco: Never Used  Substance and Sexual Activity  . Alcohol use: No  . Drug use: No  . Sexual activity: Yes  Other Topics Concern  . Not on file  Social History Narrative  . Not on file   Social Determinants of Health   Financial Resource Strain:   . Difficulty of Paying Living Expenses: Not on file  Food Insecurity:   . Worried About Programme researcher, broadcasting/film/video in the Last Year: Not on file  . Ran Out of Food in the Last Year: Not on file  Transportation Needs:   . Lack of Transportation (Medical): Not on file  . Lack of Transportation (Non-Medical): Not on file  Physical Activity:   . Days of Exercise per Week: Not on file  . Minutes of  Exercise per Session: Not on file  Stress:   . Feeling of Stress : Not on file  Social Connections:   . Frequency of Communication with Friends and Family: Not on file  . Frequency of Social Gatherings with Friends and Family: Not on file  . Attends Religious Services: Not on file  . Active Member of Clubs or Organizations: Not on file  . Attends Archivist Meetings: Not on file  . Marital Status: Not on file  Intimate Partner Violence:   . Fear of Current or Ex-Partner: Not on file  . Emotionally Abused: Not on file  . Physically Abused: Not on file  . Sexually Abused: Not on file   Family History  Problem Relation Age of Onset  . Diabetes Mellitus II Mother   . Hypertension Father   . Kidney  cancer Maternal Grandmother     OBJECTIVE:  Vitals:   05/01/19 0814  BP: 116/77  Pulse: 82  Resp: 18  Temp: 98.9 F (37.2 C)  SpO2: 96%    General appearance: ALERT; in no acute distress.  Head: NCAT Lungs: Normal respiratory effort CV: Radial pulses 2+ . Cap refill < 2 seconds Musculoskeletal: RT wrist Inspection: Localized swelling to medial wrist Palpation: Diffusely TTP over RT thumb, and distal medial posterior wrist ROM: LROM about the wrist Strength: 4+/5 grip strength Skin: warm and dry Neurologic: Ambulates without difficulty; Sensation intact about the upper extremities Psychological: alert and cooperative; normal mood and affect  DIAGNOSTIC STUDIES:  DG Hand Complete Right  Result Date: 05/01/2019 CLINICAL DATA:  Palpable abnormality right thumb and wrist ulnar aspect, pain and swelling EXAM: RIGHT HAND - COMPLETE 3+ VIEW COMPARISON:  None. FINDINGS: Frontal, oblique, and lateral views of the right hand demonstrate no acute displaced fracture. Irregularity of the ulnar styloid may reflect prior healed fracture. Alignment is anatomic. Joint spaces are well preserved. Soft tissues are normal. IMPRESSION: 1. No acute bony abnormality. Electronically Signed   By: Randa Ngo M.D.   On: 05/01/2019 08:42    X-rays negative for bony abnormalities including fracture, or dislocation.    I have reviewed the x-rays myself and the radiologist interpretation. I am in agreement with the radiologist interpretation.     ASSESSMENT & PLAN:  1. Right wrist pain   2. Acute gout of right wrist, unspecified cause     Meds ordered this encounter  Medications  . methylPREDNISolone sodium succinate (SOLU-MEDROL) 125 mg/2 mL injection 125 mg  . colchicine 0.6 MG tablet    Sig: 1.2 mg (two 0.6-mg tablets) orally at the first sign of a flare followed by 0.6 mg (1 tablet) one hour later; MAX 1.8 mg over 1 hour    Dispense:  10 tablet    Refill:  0    Order Specific Question:    Supervising Provider    Answer:   Raylene Everts [[id number]]   Steroid shot given in office Use wrist splint as needed for comfort Continue conservative management of rest, ice, and gentle stretches Colchicine prescribed.  Take as directed Follow up with PCP if symptoms persist Return or go to the ER if you have any new or worsening symptoms (fever, chills, chest pain, increased swelling, redness, nausea, vomiting, etc...)   Reviewed expectations re: course of current medical issues. Questions answered. Outlined signs and symptoms indicating need for more acute intervention. Patient verbalized understanding. After Visit Summary given.    Stacey Drain Darling, PA-C 05/01/19 [phone]

## 2019-05-01 NOTE — Discharge Instructions (Signed)
Steroid shot given in office Use wrist splint as needed for comfort Continue conservative management of rest, ice, and gentle stretches Colchicine prescribed.  Take as directed Follow up with PCP if symptoms persist Return or go to the ER if you have any new or worsening symptoms (fever, chills, chest pain, increased swelling, redness, nausea, vomiting, etc...)

## 2019-05-04 ENCOUNTER — Telehealth: Payer: Self-pay | Admitting: Emergency Medicine

## 2019-05-04 MED ORDER — PREDNISONE 10 MG (21) PO TBPK: ORAL_TABLET | Freq: Every day | ORAL | 0 refills | Status: DC

## 2019-05-04 NOTE — Telephone Encounter (Signed)
Complaining of persistent wrist pain despite colchicine RX.  Does mention improvement with steroid shot in office. We will trial a prednisone dos pak.  Denies fever, chills, nausea, vomiting, redness.

## 2019-05-08 ENCOUNTER — Other Ambulatory Visit: Payer: Self-pay

## 2019-05-08 ENCOUNTER — Ambulatory Visit
Admission: EM | Admit: 2019-05-08 | Discharge: 2019-05-08 | Disposition: A | Discharge status: Home / Self Care | Payer: Medicaid Other | Attending: Emergency Medicine | Admitting: Emergency Medicine

## 2019-05-08 DIAGNOSIS — M10031 Idiopathic gout, right wrist: Secondary | ICD-10-CM | POA: Diagnosis not present

## 2019-05-08 DIAGNOSIS — M25531 Pain in right wrist: Secondary | ICD-10-CM | POA: Diagnosis not present

## 2019-05-08 MED ORDER — IBUPROFEN 800 MG PO TABS: 800.0000 mg | ORAL_TABLET | Freq: Three times a day (TID) | ORAL | 0 refills | Status: DC

## 2019-05-08 MED ORDER — KETOROLAC TROMETHAMINE 60 MG/2ML IM SOLN
60.0000 mg | Freq: Once | INTRAMUSCULAR | Status: AC
  Administered 2019-05-08: 09:00:00 60 mg via INTRAMUSCULAR

## 2019-05-08 MED ORDER — FUROSEMIDE 40 MG PO TABS: 40.0000 mg | ORAL_TABLET | Freq: Every day | ORAL | 0 refills | Status: DC

## 2019-05-08 MED ORDER — LIDOCAINE HCL 2 % EX CREA: 1.0000 | TOPICAL_CREAM | Freq: Three times a day (TID) | CUTANEOUS | 0 refills | Status: AC | PRN

## 2019-05-08 NOTE — ED Triage Notes (Signed)
Pt presents with complaints of right hand pain and swelling. States the pain goes into her right shoulder. Denies any injury. Reports being seen for the same last week. Denies relief with Tylenol, Ibuprofen, topical Diclofenac and BC powder.

## 2019-05-08 NOTE — ED Provider Notes (Signed)
[facility]    CSN: [id number] Arrival date & time: 05/08/19  7829      History   Chief Complaint Chief Complaint  Patient presents with  . Hand Pain    HPI Julie [name] is a 44 y.o. female.   Who presented to the urgent care for complaint of right wrist pain for the past 9 days.  Patient was seen on 05/01/2019 and was prescribed prednisone and colchicine.  She received prednisone shot in office.  She later  developed mild edema in her left leg.  She described the pain in her right wrist as throbbing constant pain that radiated to her shoulder.  She has tried OTC Tylenol, naproxen diclofenac ointment without relief.  Symptoms are made worse with range of motion.  Denies similar symptoms in the past.  Denies fever, chills, ecchymosis, trauma, and injury numbness, and tingling.  The history is provided by the patient. No language interpreter was used.    Past Medical History:  Diagnosis Date  . Acid reflux   . B12 deficiency 11/2014   initiated on IM B12 11/2014  . Chronic abdominal pain 2014  . Crohn disease ([place]) 2014   ileal and colonic dz.  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 10/2014 colonoscopy: ileal anastomotic stricture and colitis.  Remicade initiated 11/2014.  normal TPMT activity (18.3) 11/2014.  . Obesity 2011   314# in 2011    Patient Active Problem List   Diagnosis Date Noted  . Anemia 07/16/2016  . Hypomagnesemia 07/16/2016  . Tobacco abuse 07/15/2016  . Hypokalemia 09/30/2015  . Exacerbation of Crohn's disease ([place]) 09/30/2015    Past Surgical History:  Procedure Laterality Date  . CESAREAN SECTION  2003   vaginal delivery 2000  . COLON SURGERY  2014   terminal ileum and right colon resection  . COLONOSCOPY  10/31/14   + active Crohns severe colitis and anastomotic stricture in ileum   . ESOPHAGOGASTRODUODENOSCOPY  10/2014   normal  . LAPAROSCOPIC CHOLECYSTECTOMY  2010    OB History   No obstetric history on file.      Home  Medications    Prior to Admission medications   Medication Sig Start Date End Date Taking? Authorizing Provider  acetaminophen (TYLENOL) 500 MG tablet Take 1,500 mg by mouth every 6 (six) hours as needed for moderate pain or headache.    [provider]  cetirizine (ZYRTEC) 10 MG tablet Take 1 tablet (10 mg total) by mouth daily. 04/17/19   Wurst, [place], PA-C  colchicine 0.6 MG tablet 1.2 mg (two 0.6-mg tablets) orally at the first sign of a flare followed by 0.6 mg (1 tablet) one hour later; MAX 1.8 mg over 1 hour 05/01/19   Wurst, [place], PA-C  fluticasone (FLONASE) 50 MCG/ACT nasal spray Place 2 sprays into both nostrils daily. 04/17/19   Wurst, [place], PA-C  furosemide (LASIX) 40 MG tablet Take 1 tablet (40 mg total) by mouth daily. 05/08/19   Bre Pecina, Darrelyn Hillock, FNP  ibuprofen (ADVIL) 800 MG tablet Take 1 tablet (800 mg total) by mouth 3 (three) times daily. Take with food 05/08/19   Tashayla Therien, Darrelyn Hillock, FNP  Lidocaine HCl 2 % CREA Apply 1 each topically 3 (three) times daily as needed. 05/08/19   Advith Martine, Darrelyn Hillock, FNP  predniSONE (STERAPRED UNI-PAK 21 TAB) 10 MG (21) TBPK tablet Take by mouth daily. Take 6 tabs by mouth daily  for 2 days, then 5 tabs for 2 days, then 4 tabs for 2  days, then 3 tabs for 2 days, 2 tabs for 2 days, then 1 tab by mouth daily for 2 days 05/04/19   Alvino Chapel [place], PA-C  STELARA 90 MG/ML SOSY injection  11/29/18   [provider]  Potassium Chloride ER 20 MEQ TBCR Take 40 mEq by mouth daily. 07/16/16 02/05/19  Servando Snare, MD    Family History Family History  Problem Relation Age of Onset  . Diabetes Mellitus II Mother   . Hypertension Father   . Kidney cancer Maternal Grandmother     Social History Social History   Tobacco Use  . Smoking status: Current Every Day Smoker    Packs/day: 1.00  . Smokeless tobacco: Never Used  Substance Use Topics  . Alcohol use: No  . Drug use: No     Allergies   Sulfonamide derivatives   Review of  Systems Review of Systems  Constitutional: Negative.   Respiratory: Negative.   Cardiovascular: Negative.   Musculoskeletal: Positive for joint swelling.       Right wrist pain  All other systems reviewed and are negative.    Physical Exam Triage Vital Signs ED Triage Vitals  Enc Vitals Group     BP      Pulse      Resp      Temp      Temp src      SpO2      Weight      Height      Head Circumference      Peak Flow      Pain Score      Pain Loc      Pain Edu?      Excl. in GC?    No data found.  Updated Vital Signs BP 125/80 (BP Location: Right Arm)   Pulse 85   Temp 98.1 F (36.7 C) (Oral)   Resp 15   SpO2 98%   Visual Acuity Right Eye Distance:   Left Eye Distance:   Bilateral Distance:    Right Eye Near:   Left Eye Near:    Bilateral Near:     Physical Exam Vitals and nursing note reviewed.  Constitutional:      General: She is not in acute distress.    Appearance: Normal appearance. She is normal weight. She is not ill-appearing, toxic-appearing or diaphoretic.  Cardiovascular:     Rate and Rhythm: Normal rate and regular rhythm.     Pulses: Normal pulses.     Heart sounds: Normal heart sounds. No murmur. No gallop.   Pulmonary:     Effort: Pulmonary effort is normal. No respiratory distress.     Breath sounds: Normal breath sounds. No stridor. No wheezing, rhonchi or rales.  Chest:     Chest wall: No tenderness.  Musculoskeletal:        General: Swelling and tenderness present. No deformity or signs of injury. Normal range of motion.     Right lower leg: No edema.     Left lower leg: Edema present.  Neurological:     General: No focal deficit present.     Mental Status: She is alert and oriented to person, place, and time.     Cranial Nerves: No cranial nerve deficit.     Sensory: No sensory deficit.     Motor: No weakness.     Coordination: Coordination normal.      UC Treatments / Results  Labs (all labs ordered are listed, but  only  abnormal results are displayed) Labs Reviewed - No data to display  EKG   Radiology No results found.  Procedures Procedures (including critical care time)  Medications Ordered in UC Medications  ketorolac (TORADOL) injection 60 mg (60 mg Intramuscular Given 05/08/19 0915)    Initial Impression / Assessment and Plan / UC Course  I have reviewed the triage vital signs and the nursing notes.  Pertinent labs & imaging results that were available during my care of the patient were reviewed by me and considered in my medical decision making (see chart for details).   Right wrist pain, acute gout Arm sling was applied in office Toradol shot was given in office Ibuprofen 800 mg was prescribed Xolido lidocaine 2% was prescribed  Swelling left leg from prednisone treatment Lasix was prescribed to be used at the end of prednisone course Advised patient to increase fluid intake to prevent dehydration Advised patient to continue and complete prednisone that was previously prescribed. Return for worsening of symptoms  Final Clinical Impressions(s) / UC Diagnoses   Final diagnoses:  Right wrist pain  Acute idiopathic gout of right wrist     Discharge Instructions     Rest, ice and heat as needed Ensure adequate ROM as tolerated. Prescribed ibuprofen as needed for inflammation and pain relief Xolido 2% cream was prescribed Advised patient to follow-up with primary care and pain clinic Return here or go to ER if you have any new or worsening symptoms such as numbness/tingling, headache/blurry vision, nausea/vomiting, confusion/altered mental status, dizziness, weakness, passing out, imbalance, etc...      ED Prescriptions    Medication Sig Dispense Auth. Provider   ibuprofen (ADVIL) 800 MG tablet Take 1 tablet (800 mg total) by mouth 3 (three) times daily. Take with food 42 tablet Jenese Mischke S, FNP   Lidocaine HCl 2 % CREA Apply 1 each topically 3 (three) times daily  as needed. 118 mL Junie Avilla, Zachery Dakins, FNP   furosemide (LASIX) 40 MG tablet Take 1 tablet (40 mg total) by mouth daily. 4 tablet Cheston Coury, Zachery Dakins, FNP     I have reviewed the PDMP during this encounter.   Durward Parcel, FNP 05/08/19 [phone]

## 2019-05-08 NOTE — Discharge Instructions (Addendum)
Rest, ice and heat as needed Ensure adequate ROM as tolerated. Prescribed ibuprofen as needed for inflammation and pain relief Xolido 2% cream was prescribed Advised patient to follow-up with primary care and pain clinic Return here or go to ER if you have any new or worsening symptoms such as numbness/tingling, headache/blurry vision, nausea/vomiting, confusion/altered mental status, dizziness, weakness, passing out, imbalance, etc..Marland Kitchen

## 2019-07-27 DIAGNOSIS — K5080 Crohn's disease of both small and large intestine without complications: Secondary | ICD-10-CM | POA: Insufficient documentation

## 2019-11-30 ENCOUNTER — Other Ambulatory Visit: Payer: Self-pay

## 2019-11-30 ENCOUNTER — Ambulatory Visit
Admission: EM | Admit: 2019-11-30 | Discharge: 2019-11-30 | Disposition: A | Discharge status: Home / Self Care | Payer: Medicaid Other | Attending: Emergency Medicine | Admitting: Emergency Medicine

## 2019-11-30 DIAGNOSIS — J209 Acute bronchitis, unspecified: Secondary | ICD-10-CM

## 2019-11-30 DIAGNOSIS — R05 Cough: Secondary | ICD-10-CM

## 2019-11-30 DIAGNOSIS — Z1152 Encounter for screening for COVID-19: Secondary | ICD-10-CM

## 2019-11-30 DIAGNOSIS — R059 Cough, unspecified: Secondary | ICD-10-CM

## 2019-11-30 MED ORDER — DEXAMETHASONE SODIUM PHOSPHATE 10 MG/ML IJ SOLN
10.0000 mg | Freq: Once | INTRAMUSCULAR | Status: AC
  Administered 2019-11-30: 10 mg via INTRAMUSCULAR

## 2019-11-30 MED ORDER — BENZONATATE 100 MG PO CAPS: 100.0000 mg | ORAL_CAPSULE | Freq: Three times a day (TID) | ORAL | 0 refills | Status: DC

## 2019-11-30 NOTE — Discharge Instructions (Signed)
COVID testing ordered.  It will take between 5-7 days for test results.  Someone will contact you regarding abnormal results.    In the meantime: You should remain isolated in your home for 10 days from symptom onset AND greater than 72 hours after symptoms resolution (absence of fever without the use of fever-reducing medication and improvement in respiratory symptoms), whichever is longer Get plenty of rest and push fluids Steroid shot given in office We will hold off on steroid today Tessalon Perles prescribed for cough Use OTC zyrtec for nasal congestion, runny nose, and/or sore throat Use OTC flonase for nasal congestion and runny nose Use medications daily for symptom relief Use OTC medications like ibuprofen or tylenol as needed fever or pain Call or go to the ED if you have any new or worsening symptoms such as fever, worsening cough, shortness of breath, chest tightness, chest pain, turning blue, changes in mental status, etc..Marland Kitchen

## 2019-11-30 NOTE — ED Triage Notes (Signed)
Pt presents with cough and some sob for past week, states has chest pain with coughing

## 2019-11-30 NOTE — ED Provider Notes (Signed)
[facility]   [id number] 11/30/19 Arrival Time: 0906   CC: COVID symptoms  SUBJECTIVE: History from: patient.  Julie [name] is a 44 y.o. female who presents with dry cough, wheezing, and SOB x 1 week.  Denies sick exposure to COVID, flu or strep.  Has tried OTC medications without relief.  Symptoms are made worse with cough.  Reports previous symptoms in the past with bronchitis.  Admits to tobacco use.   Complains of associated fever, body aches, and sore throat, earlier this week, now resolved.  Denies sinus pain, rhinorrhea, chest pain, nausea, changes in bowel or bladder habits.    ROS: As per HPI.  All other pertinent ROS negative.     Past Medical History:  Diagnosis Date  . Acid reflux   . B12 deficiency 11/2014   initiated on IM B12 11/2014  . Chronic abdominal pain 2014  . Crohn disease ([facility]) 2014   ileal and colonic dz.  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 10/2014 colonoscopy: ileal anastomotic stricture and colitis.  Remicade initiated 11/2014.  normal TPMT activity (18.3) 11/2014.  . Obesity 2011   314# in 2011   Past Surgical History:  Procedure Laterality Date  . CESAREAN SECTION  2003   vaginal delivery 2000  . COLON SURGERY  2014   terminal ileum and right colon resection  . COLONOSCOPY  10/31/14   + active Crohns severe colitis and anastomotic stricture in ileum   . ESOPHAGOGASTRODUODENOSCOPY  10/2014   normal  . LAPAROSCOPIC CHOLECYSTECTOMY  2010   Allergies  Allergen Reactions  . Sulfonamide Derivatives Nausea And Vomiting   No current facility-administered medications on file prior to encounter.   Current Outpatient Medications on File Prior to Encounter  Medication Sig Dispense Refill  . acetaminophen (TYLENOL) 500 MG tablet Take 1,500 mg by mouth every 6 (six) hours as needed for moderate pain or headache.    . cetirizine (ZYRTEC) 10 MG tablet Take 1 tablet (10 mg total) by mouth daily. 30 tablet 0  . fluticasone (FLONASE) 50 MCG/ACT  nasal spray Place 2 sprays into both nostrils daily. 16 g 0  . furosemide (LASIX) 40 MG tablet Take 1 tablet (40 mg total) by mouth daily. 4 tablet 0  . ibuprofen (ADVIL) 800 MG tablet Take 1 tablet (800 mg total) by mouth 3 (three) times daily. Take with food 42 tablet 0  . Lidocaine HCl 2 % CREA Apply 1 each topically 3 (three) times daily as needed. 118 mL 0  . [DISCONTINUED] colchicine 0.6 MG tablet 1.2 mg (two 0.6-mg tablets) orally at the first sign of a flare followed by 0.6 mg (1 tablet) one hour later; MAX 1.8 mg over 1 hour 10 tablet 0  . [DISCONTINUED] Potassium Chloride ER 20 MEQ TBCR Take 40 mEq by mouth daily. 60 tablet 2   Social History   Socioeconomic History  . Marital status: Single    Spouse name: Not on file  . Number of children: Not on file  . Years of education: Not on file  . Highest education level: Not on file  Occupational History  . Not on file  Tobacco Use  . Smoking status: Current Every Day Smoker    Packs/day: 1.00  . Smokeless tobacco: Never Used  Substance and Sexual Activity  . Alcohol use: No  . Drug use: No  . Sexual activity: Yes  Other Topics Concern  . Not on file  Social History Narrative  . Not on file  Social Determinants of Health   Financial Resource Strain:   . Difficulty of Paying Living Expenses: Not on file  Food Insecurity:   . Worried About Programme researcher, broadcasting/film/video in the Last Year: Not on file  . Ran Out of Food in the Last Year: Not on file  Transportation Needs:   . Lack of Transportation (Medical): Not on file  . Lack of Transportation (Non-Medical): Not on file  Physical Activity:   . Days of Exercise per Week: Not on file  . Minutes of Exercise per Session: Not on file  Stress:   . Feeling of Stress : Not on file  Social Connections:   . Frequency of Communication with Friends and Family: Not on file  . Frequency of Social Gatherings with Friends and Family: Not on file  . Attends Religious Services: Not on file  .  Active Member of Clubs or Organizations: Not on file  . Attends Banker Meetings: Not on file  . Marital Status: Not on file  Intimate Partner Violence:   . Fear of Current or Ex-Partner: Not on file  . Emotionally Abused: Not on file  . Physically Abused: Not on file  . Sexually Abused: Not on file   Family History  Problem Relation Age of Onset  . Diabetes Mellitus II Mother   . Hypertension Father   . Kidney cancer Maternal Grandmother     OBJECTIVE:  Vitals:   11/30/19 0915  BP: 115/82  Pulse: (!) 111  Resp: 20  Temp: 99.4 F (37.4 C)  SpO2: 97%     General appearance: alert; appears fatigued, but nontoxic; speaking in full sentences and tolerating own secretions HEENT: NCAT; Ears: EACs clear, TMs pearly gray; Eyes: PERRL.  EOM grossly intact. Nose: nares patent without rhinorrhea, Throat: oropharynx clear, tonsils non erythematous or enlarged, uvula midline  Neck: supple without LAD Lungs: unlabored respirations, symmetrical air entry; cough: mild; no respiratory distress; diffuse wheezes heard throughout bilateral lungs Heart: regular rate and rhythm.  Skin: warm and dry Psychological: alert and cooperative; normal mood and affect  ASSESSMENT & PLAN:  1. Encounter for screening for COVID-19   2. Cough   3. Acute bronchitis, unspecified organism     Meds ordered this encounter  Medications  . benzonatate (TESSALON) 100 MG capsule    Sig: Take 1 capsule (100 mg total) by mouth every 8 (eight) hours.    Dispense:  21 capsule    Refill:  0    Order Specific Question:   Supervising Provider    Answer:   Eustace Moore [[id number]]  . dexamethasone (DECADRON) injection 10 mg   COVID testing ordered.  It will take between 5-7 days for test results.  Someone will contact you regarding abnormal results.    In the meantime: You should remain isolated in your home for 10 days from symptom onset AND greater than 72 hours after symptoms resolution  (absence of fever without the use of fever-reducing medication and improvement in respiratory symptoms), whichever is longer Get plenty of rest and push fluids Steroid shot given in office We will hold off on steroid today Tessalon Perles prescribed for cough Use OTC zyrtec for nasal congestion, runny nose, and/or sore throat Use OTC flonase for nasal congestion and runny nose Use medications daily for symptom relief Use OTC medications like ibuprofen or tylenol as needed fever or pain Call or go to the ED if you have any new or worsening symptoms such as fever,  worsening cough, shortness of breath, chest tightness, chest pain, turning blue, changes in mental status, etc...   Reviewed expectations re: course of current medical issues. Questions answered. Outlined signs and symptoms indicating need for more acute intervention. Patient verbalized understanding. After Visit Summary given.         Rennis Harding, PA-C 11/30/19 [phone]

## 2019-12-01 LAB — NOVEL CORONAVIRUS, NAA: SARS-CoV-2, NAA: DETECTED — AB

## 2019-12-02 ENCOUNTER — Other Ambulatory Visit (HOSPITAL_COMMUNITY): Payer: Self-pay | Admitting: Oncology

## 2019-12-02 ENCOUNTER — Encounter: Payer: Self-pay | Admitting: Oncology

## 2019-12-02 DIAGNOSIS — U071 COVID-19: Secondary | ICD-10-CM

## 2019-12-02 NOTE — Progress Notes (Signed)
I connected by phone with  Julie [name]  to discuss the potential use of an new treatment for mild to moderate COVID-19 viral infection in non-hospitalized patients.   This patient is a age/sex that meets the FDA criteria for Emergency Use Authorization of casirivimab\imdevimab.  Has a (+) direct SARS-CoV-2 viral test result 1. Has mild or moderate COVID-19  2. Is ? 44 years of age and weighs ? 40 kg 3. Is NOT hospitalized due to COVID-19 4. Is NOT requiring oxygen therapy or requiring an increase in baseline oxygen flow rate due to COVID-19 5. Is within 10 days of symptom onset 6. Has at least one of the high risk factor(s) for progression to severe COVID-19 and/or hospitalization as defined in EUA. ? Specific high risk criteria :crohns disease   Symptom onset  11/26/19   I have spoken and communicated the following to the patient or parent/caregiver:   1. FDA has authorized the emergency use of casirivimab\imdevimab for the treatment of mild to moderate COVID-19 in adults and pediatric patients with positive results of direct SARS-CoV-2 viral testing who are 30 years of age and older weighing at least 40 kg, and who are at high risk for progressing to severe COVID-19 and/or hospitalization.   2. The significant known and potential risks and benefits of casirivimab\imdevimab, and the extent to which such potential risks and benefits are unknown.   3. Information on available alternative treatments and the risks and benefits of those alternatives, including clinical trials.   4. Patients treated with casirivimab\imdevimab should continue to self-isolate and use infection control measures (e.g., wear mask, isolate, social distance, avoid sharing personal items, clean and disinfect "high touch" surfaces, and frequent handwashing) according to CDC guidelines.    5. The patient or parent/caregiver has the option to accept or refuse casirivimab\imdevimab .   After reviewing this information with  the patient, The patient agreed to proceed with receiving casirivimab\imdevimab infusion and will be provided a copy of the Fact sheet prior to receiving the infusion.Mignon Pine, AGNP-C [phone] (Infusion Center Hotline)

## 2019-12-03 ENCOUNTER — Ambulatory Visit (HOSPITAL_COMMUNITY): Payer: Medicaid Other | Attending: Pulmonary Disease

## 2020-01-15 ENCOUNTER — Ambulatory Visit: Payer: Self-pay

## 2020-02-05 ENCOUNTER — Ambulatory Visit: Payer: Self-pay

## 2020-03-13 ENCOUNTER — Ambulatory Visit: Payer: Medicaid Other | Admitting: Internal Medicine

## 2020-03-13 ENCOUNTER — Encounter: Payer: Self-pay | Admitting: Internal Medicine

## 2020-03-13 ENCOUNTER — Other Ambulatory Visit: Payer: Self-pay

## 2020-03-13 VITALS — BP 113/78 | HR 122 | Temp 99.1°F | Resp 18 | Ht 62.0 in | Wt 155.1 lb

## 2020-03-13 DIAGNOSIS — K5080 Crohn's disease of both small and large intestine without complications: Secondary | ICD-10-CM

## 2020-03-13 DIAGNOSIS — Z9889 Other specified postprocedural states: Secondary | ICD-10-CM

## 2020-03-13 DIAGNOSIS — Z932 Ileostomy status: Secondary | ICD-10-CM

## 2020-03-13 DIAGNOSIS — Z23 Encounter for immunization: Secondary | ICD-10-CM

## 2020-03-13 DIAGNOSIS — E538 Deficiency of other specified B group vitamins: Secondary | ICD-10-CM

## 2020-03-13 DIAGNOSIS — Z8719 Personal history of other diseases of the digestive system: Secondary | ICD-10-CM

## 2020-03-13 DIAGNOSIS — Z720 Tobacco use: Secondary | ICD-10-CM

## 2020-03-13 DIAGNOSIS — Z7689 Persons encountering health services in other specified circumstances: Secondary | ICD-10-CM | POA: Insufficient documentation

## 2020-03-13 DIAGNOSIS — F339 Major depressive disorder, recurrent, unspecified: Secondary | ICD-10-CM

## 2020-03-13 MED ORDER — ONDANSETRON 4 MG PO TBDP: 4.0000 mg | ORAL_TABLET | Freq: Three times a day (TID) | ORAL | 0 refills | Status: DC | PRN

## 2020-03-13 MED ORDER — SERTRALINE HCL 50 MG PO TABS: 50.0000 mg | ORAL_TABLET | Freq: Every day | ORAL | 3 refills | Status: DC

## 2020-03-13 NOTE — Assessment & Plan Note (Signed)
Care established Previous chart reviewed History and medications reviewed with the patient 

## 2020-03-13 NOTE — Assessment & Plan Note (Signed)
In 2018 after ileal resection for Crohn's disease complication Used to follow up with GI at [facility], prefers a local GI care. Referral provided.

## 2020-03-13 NOTE — Progress Notes (Signed)
New Patient Office Visit  Subjective:  Patient ID: Julie [name], female    DOB: 1975-09-05  Age: 44 y.o. MRN: [id number]  CC:  Chief Complaint  Patient presents with  . New Patient (Initial Visit)    New patient former [facility] she was covid positive in sept and ever since then cannot keep food down she has been given phenergin before and this doesn't help    HPI Julie [name] is a 44 year old female with past medical history of Crohn's disease, s/p ileal resection and ileostomy closed in 2018, perforated peptic ulcer with adhesions s/p exploratory laparotomy, antrectomy with Billroth I surgery in 06/2019, and revision surgery for extensive lysis of adhesions for SBO in 08/2019, anxiety with depression and tobacco abuse presents for establishing care.  Patient has a extensive GI history and surgical history.  She currently is not following up with any GI and has not been on Stelara for her Crohn's disease for many months.  She requests a referral to local GI.  Patient has been having chronic nausea since she had Covid infection in 11/2019.  She states that Phenergan has not been helping her.  She has been taking vitamin B12 supplements.  She has been feeling depressed and anxious for many years.  She has tried Effexor, citalopram and trazodone in the past.  She denies any suicidal or homicidal ideation.  Patient has had 2 doses of Covid vaccine.  Patient received flu vaccine in the office today.  Past Medical History:  Diagnosis Date  . Acid reflux   . B12 deficiency 11/2014   initiated on IM B12 11/2014  . Chronic abdominal pain 2014  . Crohn disease ([place]) 2014   ileal and colonic dz.  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 10/2014 colonoscopy: ileal anastomotic stricture and colitis.  Remicade initiated 11/2014.  normal TPMT activity (18.3) 11/2014.  . Obesity 2011   314# in 2011    Past Surgical History:  Procedure Laterality Date  . CESAREAN SECTION  2003    vaginal delivery 2000  . COLON SURGERY  2014   terminal ileum and right colon resection  . COLONOSCOPY  10/31/14   + active Crohns severe colitis and anastomotic stricture in ileum   . ESOPHAGOGASTRODUODENOSCOPY  10/2014   normal  . LAPAROSCOPIC CHOLECYSTECTOMY  2010    Family History  Problem Relation Age of Onset  . Diabetes Mellitus II Mother   . Hypertension Father   . Kidney cancer Maternal Grandmother     Social History   Socioeconomic History  . Marital status: Single    Spouse name: Not on file  . Number of children: Not on file  . Years of education: Not on file  . Highest education level: Not on file  Occupational History  . Not on file  Tobacco Use  . Smoking status: Current Every Day Smoker    Packs/day: 1.00  . Smokeless tobacco: Never Used  Substance and Sexual Activity  . Alcohol use: No  . Drug use: No  . Sexual activity: Yes  Other Topics Concern  . Not on file  Social History Narrative  . Not on file   Social Determinants of Health   Financial Resource Strain: Not on file  Food Insecurity: Not on file  Transportation Needs: Not on file  Physical Activity: Not on file  Stress: Not on file  Social Connections: Not on file  Intimate Partner Violence: Not on file    ROS Review of Systems  Constitutional: Positive for fatigue. Negative for chills and fever.  HENT: Negative for congestion, sinus pressure, sinus pain and sore throat.   Eyes: Negative for pain and discharge.  Respiratory: Negative for cough and shortness of breath.   Cardiovascular: Negative for chest pain and palpitations.  Gastrointestinal: Positive for nausea and vomiting. Negative for abdominal pain, constipation and diarrhea.  Endocrine: Negative for polydipsia and polyuria.  Genitourinary: Negative for dysuria and hematuria.  Musculoskeletal: Negative for neck pain and neck stiffness.  Skin: Negative for rash.  Neurological: Negative for dizziness and weakness.   Psychiatric/Behavioral: Positive for decreased concentration and sleep disturbance. Negative for agitation, behavioral problems and suicidal ideas. The patient is nervous/anxious.     Objective:   Today's Vitals: BP 113/78 (BP Location: Right Arm, Patient Position: Sitting, Cuff Size: Normal)   Pulse (!) 122   Temp 99.1 F (37.3 C) (Oral)   Resp 18   Ht 5' 2"  (1.575 m)   Wt 155 lb 1.9 oz (70.4 kg)   SpO2 97%   BMI 28.37 kg/m   Physical Exam Vitals reviewed.  Constitutional:      General: She is not in acute distress.    Appearance: She is not diaphoretic.  HENT:     Head: Normocephalic and atraumatic.     Nose: Nose normal.     Mouth/Throat:     Mouth: Mucous membranes are moist.  Eyes:     General: No scleral icterus.    Extraocular Movements: Extraocular movements intact.     Pupils: Pupils are equal, round, and reactive to light.  Cardiovascular:     Rate and Rhythm: Normal rate and regular rhythm.     Pulses: Normal pulses.     Heart sounds: Normal heart sounds. No murmur heard.   Pulmonary:     Breath sounds: Normal breath sounds. No wheezing or rales.  Abdominal:     Palpations: Abdomen is soft.     Tenderness: There is no abdominal tenderness. There is no guarding or rebound.  Musculoskeletal:     Cervical back: Neck supple. No tenderness.     Right lower leg: No edema.     Left lower leg: No edema.  Skin:    General: Skin is warm.     Findings: No rash.  Neurological:     General: No focal deficit present.     Mental Status: She is alert and oriented to person, place, and time.     Sensory: No sensory deficit.     Motor: No weakness.  Psychiatric:        Mood and Affect: Mood normal.        Behavior: Behavior normal.     Assessment & Plan:   Problem List Items Addressed This Visit      Encounter to establish care - Primary   Care established Previous chart reviewed History and medications reviewed with the patient     Relevant Orders   CBC with Differential/Platelet  CMP14+EGFR  Hemoglobin A1c  Lipid Profile  TSH  Vitamin D (25 hydroxy)  B12    Digestive   Crohn's disease of both small and large intestine ([place])    Was on Stelara, has run out of it. Has not followed up with GI for many months. Prefers a GI in [place], referral provided S/p ileal resection and ileostomy, removed later On Vit B12 supplements      Relevant Medications   ondansetron (ZOFRAN ODT) 4 MG disintegrating tablet   Other Relevant Orders  Vitamin D (25 hydroxy)   B12   Ambulatory referral to Gastroenterology     Other   Tobacco abuse    Asked about quitting: confirms that she currently smokes cigarettes Advise to quit smoking: Educated about QUITTING to reduce the risk of cancer, cardio and cerebrovascular disease. Assess willingness: Unwilling to quit at this time, but is working on cutting back. Assist with counseling and pharmacotherapy: Counseled for 5 minutes and literature provided. Arrange for follow up: Follow up in 3 months and continue to offer help.      H/O small bowel obstruction    In 37/4827, likely a complication from previous surgery for perforated peptic ulcers S/p small bowel resection in 08/2019      Status post ileostomy ([place])    In 2018 after ileal resection for Crohn's disease complication Used to follow up with GI at [facility], prefers a local GI care. Referral provided.      Relevant Orders   CMP14+EGFR   Vitamin D (25 hydroxy)   B12   Ambulatory referral to Gastroenterology   Vitamin B12 deficiency    Likely in the setting of ileal resection Vitamin B12 supplements Will check Vitamin B12, probably will need IM injections.         History of Billroth I operation   Relevant Medications   ondansetron (ZOFRAN ODT) 4 MG disintegrating tablet   Other Relevant Orders   Ambulatory referral to Gastroenterology   Depression, recurrent ([place])    For many years Has tried Effexor, Citalopram, and  Trazodone in the past Will start Zoloft      Relevant Medications   sertraline (ZOLOFT) 50 MG tablet   Other Relevant Orders   TSH   Vitamin D (25 hydroxy)    Other Visit Diagnoses    Need for immunization against influenza       Relevant Orders   Flu Vaccine QUAD 36+ mos IM (Completed)      Outpatient Encounter Medications as of 03/13/2020  Medication Sig  . cetirizine (ZYRTEC) 10 MG tablet Take 1 tablet (10 mg total) by mouth daily.  . fluticasone (FLONASE) 50 MCG/ACT nasal spray Place 2 sprays into both nostrils daily.  . Lidocaine HCl 2 % CREA Apply 1 each topically 3 (three) times daily as needed.  . [DISCONTINUED] promethazine (PHENERGAN) 12.5 MG tablet Take 25 mg by mouth every 8 (eight) hours as needed.  . ondansetron (ZOFRAN ODT) 4 MG disintegrating tablet Take 1 tablet (4 mg total) by mouth every 8 (eight) hours as needed for nausea or vomiting.  . sertraline (ZOLOFT) 50 MG tablet Take 1 tablet (50 mg total) by mouth daily.  . [DISCONTINUED] acetaminophen (TYLENOL) 500 MG tablet Take 1,500 mg by mouth every 6 (six) hours as needed for moderate pain or headache. (Patient not taking: Reported on 03/13/2020)  . [DISCONTINUED] benzonatate (TESSALON) 100 MG capsule Take 1 capsule (100 mg total) by mouth every 8 (eight) hours. (Patient not taking: Reported on 03/13/2020)  . [DISCONTINUED] colchicine 0.6 MG tablet 1.2 mg (two 0.6-mg tablets) orally at the first sign of a flare followed by 0.6 mg (1 tablet) one hour later; MAX 1.8 mg over 1 hour  . [DISCONTINUED] furosemide (LASIX) 40 MG tablet Take 1 tablet (40 mg total) by mouth daily. (Patient not taking: Reported on 03/13/2020)  . [DISCONTINUED] ibuprofen (ADVIL) 800 MG tablet Take 1 tablet (800 mg total) by mouth 3 (three) times daily. Take with food (Patient not taking: Reported on 03/13/2020)  . [DISCONTINUED]  Potassium Chloride ER 20 MEQ TBCR Take 40 mEq by mouth daily.   No facility-administered encounter medications on  file as of 03/13/2020.    Follow-up: Return in about 3 months (around 06/11/2020).   Lindell Spar, MD

## 2020-03-13 NOTE — Assessment & Plan Note (Signed)
Was on Stelara, has run out of it. Has not followed up with GI for many months. Prefers a GI in [place], referral provided S/p ileal resection and ileostomy, removed later On Vit B12 supplements

## 2020-03-13 NOTE — Assessment & Plan Note (Signed)
Likely in the setting of ileal resection Vitamin B12 supplements Will check Vitamin B12, probably will need IM injections.

## 2020-03-13 NOTE — Patient Instructions (Signed)
Please take Zofran as prescribed for nausea/vomiting. Please take at least 64 oz of fluid in a day.  Please continue to take Vitamin B12 and multivitamin supplements.  Please start taking Zoloft as prescribed.  Please try to cut down smoking, and we will talk further about smoking cessation measures in the next visit.  Thank you for choosing [facility]. We consider it our privilege to take care of you.

## 2020-03-13 NOTE — Assessment & Plan Note (Signed)
Asked about quitting: confirms that she currently smokes cigarettes Advise to quit smoking: Educated about QUITTING to reduce the risk of cancer, cardio and cerebrovascular disease. Assess willingness: Unwilling to quit at this time, but is working on cutting back. Assist with counseling and pharmacotherapy: Counseled for 5 minutes and literature provided. Arrange for follow up: Follow up in 3 months and continue to offer help. 

## 2020-03-13 NOTE — Assessment & Plan Note (Signed)
For many years Has tried Effexor, Citalopram, and Trazodone in the past Will start Zoloft

## 2020-03-13 NOTE — Assessment & Plan Note (Signed)
In 08/2019, likely a complication from previous surgery for perforated peptic ulcers S/p small bowel resection in 08/2019

## 2020-03-14 ENCOUNTER — Telehealth: Payer: Self-pay | Admitting: *Deleted

## 2020-03-14 LAB — CMP14+EGFR
ALT: 14 IU/L (ref 0–32)
AST: 17 IU/L (ref 0–40)
Albumin/Globulin Ratio: 0.8 — ABNORMAL LOW (ref 1.2–2.2)
Albumin: 3.1 g/dL — ABNORMAL LOW (ref 3.8–4.8)
Alkaline Phosphatase: 152 IU/L — ABNORMAL HIGH (ref 44–121)
BUN/Creatinine Ratio: 13 (ref 9–23)
BUN: 9 mg/dL (ref 6–24)
Bilirubin Total: 0.2 mg/dL (ref 0.0–1.2)
CO2: 23 mmol/L (ref 20–29)
Calcium: 8.9 mg/dL (ref 8.7–10.2)
Chloride: 96 mmol/L (ref 96–106)
Creatinine, Ser: 0.72 mg/dL (ref 0.57–1.00)
GFR calc Af Amer: 119 mL/min/{1.73_m2} (ref >59)
GFR calc non Af Amer: 104 mL/min/{1.73_m2} (ref >59)
Globulin, Total: 4.1 g/dL (ref 1.5–4.5)
Glucose: 101 mg/dL — ABNORMAL HIGH (ref 65–99)
Potassium: 3.3 mmol/L — ABNORMAL LOW (ref 3.5–5.2)
Sodium: 137 mmol/L (ref 134–144)
Total Protein: 7.2 g/dL (ref 6.0–8.5)

## 2020-03-14 LAB — CBC WITH DIFFERENTIAL/PLATELET
Basophils Absolute: 0.1 10*3/uL (ref 0.0–0.2)
Basos: 1 %
EOS (ABSOLUTE): 0.1 10*3/uL (ref 0.0–0.4)
Eos: 1 %
Hematocrit: 46 % (ref 34.0–46.6)
Hemoglobin: 15.1 g/dL (ref 11.1–15.9)
Immature Grans (Abs): 0.1 10*3/uL (ref 0.0–0.1)
Immature Granulocytes: 1 %
Lymphocytes Absolute: 2.9 10*3/uL (ref 0.7–3.1)
Lymphs: 28 %
MCH: 29.9 pg (ref 26.6–33.0)
MCHC: 32.8 g/dL (ref 31.5–35.7)
MCV: 91 fL (ref 79–97)
Monocytes Absolute: 0.4 10*3/uL (ref 0.1–0.9)
Monocytes: 4 %
Neutrophils Absolute: 6.8 10*3/uL (ref 1.4–7.0)
Neutrophils: 65 %
Platelets: 394 10*3/uL (ref 150–450)
RBC: 5.05 x10E6/uL (ref 3.77–5.28)
RDW: 14.3 % (ref 11.7–15.4)
WBC: 10.2 10*3/uL (ref 3.4–10.8)

## 2020-03-14 LAB — VITAMIN D 25 HYDROXY (VIT D DEFICIENCY, FRACTURES): Vit D, 25-Hydroxy: 9.8 ng/mL — ABNORMAL LOW (ref 30.0–100.0)

## 2020-03-14 LAB — LIPID PANEL
Chol/HDL Ratio: 3 ratio (ref 0.0–4.4)
Cholesterol, Total: 96 mg/dL — ABNORMAL LOW (ref 100–199)
HDL: 32 mg/dL — ABNORMAL LOW (ref >39)
LDL Chol Calc (NIH): 42 mg/dL (ref 0–99)
Triglycerides: 123 mg/dL (ref 0–149)
VLDL Cholesterol Cal: 22 mg/dL (ref 5–40)

## 2020-03-14 LAB — HEMOGLOBIN A1C
Est. average glucose Bld gHb Est-mCnc: 108 mg/dL
Hgb A1c MFr Bld: 5.4 % (ref 4.8–5.6)

## 2020-03-14 LAB — VITAMIN B12: Vitamin B-12: 507 pg/mL (ref 232–1245)

## 2020-03-14 LAB — TSH: TSH: 3.62 u[IU]/mL (ref 0.450–4.500)

## 2020-03-14 NOTE — Telephone Encounter (Signed)
Pt had visit yesterday was wondering if you were still going to send something in for sleep please advise

## 2020-03-14 NOTE — Telephone Encounter (Signed)
Pt advised zoloft was prescribed to try this for two weeks and if it is not better to call us back with verbal understanding

## 2020-03-14 NOTE — Telephone Encounter (Signed)
I started her on Celexa. Will wait for its response for now. I had discussed it with her. Please advise her to call us after 2 weeks if she has persistent symptoms.

## 2020-03-17 ENCOUNTER — Telehealth (INDEPENDENT_AMBULATORY_CARE_PROVIDER_SITE_OTHER): Payer: Medicaid Other | Admitting: Internal Medicine

## 2020-03-17 ENCOUNTER — Ambulatory Visit (INDEPENDENT_AMBULATORY_CARE_PROVIDER_SITE_OTHER): Payer: Medicaid Other | Admitting: Internal Medicine

## 2020-03-17 ENCOUNTER — Other Ambulatory Visit: Payer: Self-pay

## 2020-03-17 ENCOUNTER — Encounter: Payer: Self-pay | Admitting: Internal Medicine

## 2020-03-17 DIAGNOSIS — E559 Vitamin D deficiency, unspecified: Secondary | ICD-10-CM | POA: Diagnosis not present

## 2020-03-17 DIAGNOSIS — E876 Hypokalemia: Secondary | ICD-10-CM

## 2020-03-17 MED ORDER — VITAMIN D (ERGOCALCIFEROL) 1.25 MG (50000 UNIT) PO CAPS: 50000.0000 [IU] | ORAL_CAPSULE | ORAL | 1 refills | Status: DC

## 2020-03-17 NOTE — Patient Instructions (Addendum)
Please start taking Vitamin D 50,000 IU every week.  Please take potassium rich diet. Certain vegetables and fruits are rich source of potassium.  Bananas, oranges, cantaloupe, honeydew, apricots, grapefruit (some dried fruits, such as prunes, raisins, and dates, are also high in potassium) Cooked spinach Cooked broccoli Potatoes Sweet potatoes Mushrooms Peas Cucumbers Zucchini Pumpkins Leafy greens

## 2020-03-17 NOTE — Progress Notes (Signed)
Virtual Visit via Telephone Note   This visit type was conducted due to national recommendations for restrictions regarding the COVID-19 Pandemic (e.g. social distancing) in an effort to limit this patient's exposure and mitigate transmission in our community.  Due to her co-morbid illnesses, this patient is at least at moderate risk for complications without adequate follow up.  This format is felt to be most appropriate for this patient at this time.  The patient did not have access to video technology/had technical difficulties with video requiring transitioning to audio format only (telephone).  All issues noted in this document were discussed and addressed.  No physical exam could be performed with this format.  Evaluation Performed:  Follow-up visit  Date:  03/17/2020   ID:  Julie [name], DOB 09-28-1975, MRN [id number]  Patient Location: Home Provider Location: Home Office  Location of Patient: Home Location of Provider: Telehealth Consent was obtain for visit to be over via telehealth. I verified that I am speaking with the correct person using two identifiers.  PCP:  Anabel Halon, MD   Chief Complaint:  Follow up and discuss blood tests  History of Present Illness:    Julie [name] is a 44 y.o. female with past medical history of Crohn's disease, s/p ileal resection and ileostomy closed in 2018, perforated peptic ulcer with adhesions s/p exploratory laparotomy, antrectomy with Billroth I surgery in 06/2019, and revision surgery for extensive lysis of adhesions for SBO in 08/2019, anxiety with depression and tobacco abuse  who has a follow-up televisit for her nausea and for discussing her blood test.  Patient has tried taking Zofran for her nausea, and feels little better compared to Phenergan.  She has an appointment with GI today for management of Crohn's disease.  Blood test with reviewed and discussed with the patient in detail.  Additional questions and concerns  addressed.  The patient does not have symptoms concerning for COVID-19 infection (fever, chills, cough, or new shortness of breath).   Past Medical, Surgical, Social History, Allergies, and Medications have been Reviewed.  Past Medical History:  Diagnosis Date  . Acid reflux   . B12 deficiency 11/2014   initiated on IM B12 11/2014  . Chronic abdominal pain 2014  . Crohn disease ([facility]) 2014   ileal and colonic dz.  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 10/2014 colonoscopy: ileal anastomotic stricture and colitis.  Remicade initiated 11/2014.  normal TPMT activity (18.3) 11/2014.  Marland Kitchen GERD (gastroesophageal reflux disease)    Phreesia 03/17/2020  . Obesity 2011   314# in 2011   Past Surgical History:  Procedure Laterality Date  . CESAREAN SECTION  2003   vaginal delivery 2000  . COLON SURGERY  2014   terminal ileum and right colon resection  . COLONOSCOPY  10/31/14   + active Crohns severe colitis and anastomotic stricture in ileum   . ESOPHAGOGASTRODUODENOSCOPY  10/2014   normal  . LAPAROSCOPIC CHOLECYSTECTOMY  2010     Current Meds  Medication Sig  . cetirizine (ZYRTEC) 10 MG tablet Take 1 tablet (10 mg total) by mouth daily.  . fluticasone (FLONASE) 50 MCG/ACT nasal spray Place 2 sprays into both nostrils daily.  . Lidocaine HCl 2 % CREA Apply 1 each topically 3 (three) times daily as needed.  . ondansetron (ZOFRAN ODT) 4 MG disintegrating tablet Take 1 tablet (4 mg total) by mouth every 8 (eight) hours as needed for nausea or vomiting.  . sertraline (ZOLOFT) 50 MG tablet Take  1 tablet (50 mg total) by mouth daily.     Allergies:   Sulfonamide derivatives and Sulfa antibiotics   ROS:   Review of Systems  Constitutional: Positive for fatigue. Negative for chills and fever.  HENT: Negative for congestion, sinus pressure, sinus pain and sore throat.   Eyes: Negative for pain and discharge.  Respiratory: Negative for cough and shortness of breath.   Cardiovascular:  Negative for chest pain and palpitations.  Gastrointestinal: Positive for nausea and vomiting. Negative for abdominal pain, constipation and diarrhea.  Endocrine: Negative for polydipsia and polyuria.  Genitourinary: Negative for dysuria and hematuria.  Musculoskeletal: Negative for neck pain and neck stiffness.  Skin: Negative for rash.  Neurological: Negative for dizziness and weakness.  Psychiatric/Behavioral: Positive for decreased concentration and sleep disturbance. Negative for agitation, behavioral problems and suicidal ideas. The patient is nervous/anxious.  Labs/Other Tests and Data Reviewed:    Recent Labs: 03/13/2020: ALT 14; BUN 9; Creatinine, Ser 0.72; Hemoglobin 15.1; Platelets 394; Potassium 3.3; Sodium 137; TSH 3.620   Recent Lipid Panel Lab Results  Component Value Date/Time   CHOL 96 (L) 03/13/2020 03:05 PM   TRIG 123 03/13/2020 03:05 PM   HDL 32 (L) 03/13/2020 03:05 PM   CHOLHDL 3.0 03/13/2020 03:05 PM   LDLCALC 42 03/13/2020 03:05 PM    Wt Readings from Last 3 Encounters:  03/13/20 155 lb 1.9 oz (70.4 kg)  07/16/16 172 lb 9.6 oz (78.3 kg)  11/16/15 193 lb (87.5 kg)     ASSESSMENT & PLAN:    Vitamin D deficiency Last vitamin D Lab Results  Component Value Date   VD25OH 9.8 (L) 03/13/2020  Likely in the setting of malabsorption due to history of small bowel resection Started vitamin D 50,000 IU every week  Hypokalemia Lab Results  Component Value Date   K 3.3 (L) 03/13/2020  Noted to be consistently low in the chart Likely in the setting of chronic diarrhea Currently not on any treatment for Crohn's disease, has an appointment with GI today.    Time:   Today, I have spent 12 minutes reviewing the chart, including problem list, medications, and with the patient with telehealth technology discussing the above problems.   Medication Adjustments/Labs and Tests Ordered: Current medicines are reviewed at length with the patient today.  Concerns  regarding medicines are outlined above.   Tests Ordered: No orders of the defined types were placed in this encounter.   Medication Changes: No orders of the defined types were placed in this encounter.    Note: This dictation was prepared with Dragon dictation along with smaller phrase technology. Similar sounding words can be transcribed inadequately or may not be corrected upon review. Any transcriptional errors that result from this process are unintentional.      Disposition:  Follow up  Signed, Anabel Halon, MD  03/17/2020 10:38 AM     [facility]

## 2020-03-18 ENCOUNTER — Telehealth (INDEPENDENT_AMBULATORY_CARE_PROVIDER_SITE_OTHER): Payer: Self-pay | Admitting: *Deleted

## 2020-03-18 ENCOUNTER — Encounter (INDEPENDENT_AMBULATORY_CARE_PROVIDER_SITE_OTHER): Payer: Self-pay | Admitting: Internal Medicine

## 2020-03-18 ENCOUNTER — Ambulatory Visit (INDEPENDENT_AMBULATORY_CARE_PROVIDER_SITE_OTHER): Payer: Medicaid Other | Admitting: Internal Medicine

## 2020-03-18 NOTE — Telephone Encounter (Signed)
Patient was a no show today 03/18/20 for office visit with Dr.Rehman.

## 2020-03-31 ENCOUNTER — Other Ambulatory Visit: Payer: Self-pay | Admitting: Internal Medicine

## 2020-03-31 DIAGNOSIS — K5080 Crohn's disease of both small and large intestine without complications: Secondary | ICD-10-CM

## 2020-03-31 DIAGNOSIS — Z9889 Other specified postprocedural states: Secondary | ICD-10-CM

## 2020-04-13 ENCOUNTER — Other Ambulatory Visit: Payer: Self-pay | Admitting: Internal Medicine

## 2020-04-13 DIAGNOSIS — K5080 Crohn's disease of both small and large intestine without complications: Secondary | ICD-10-CM

## 2020-04-13 DIAGNOSIS — Z9889 Other specified postprocedural states: Secondary | ICD-10-CM

## 2020-04-13 IMAGING — DX DG HAND COMPLETE 3+V*R*
3 series · 3 of 3 positions shown · non-contrast
Comparison: None.

CLINICAL DATA: Palpable abnormality right thumb and wrist ulnar
aspect, pain and swelling

EXAM:
RIGHT HAND - COMPLETE 3+ VIEW

[hand pa]
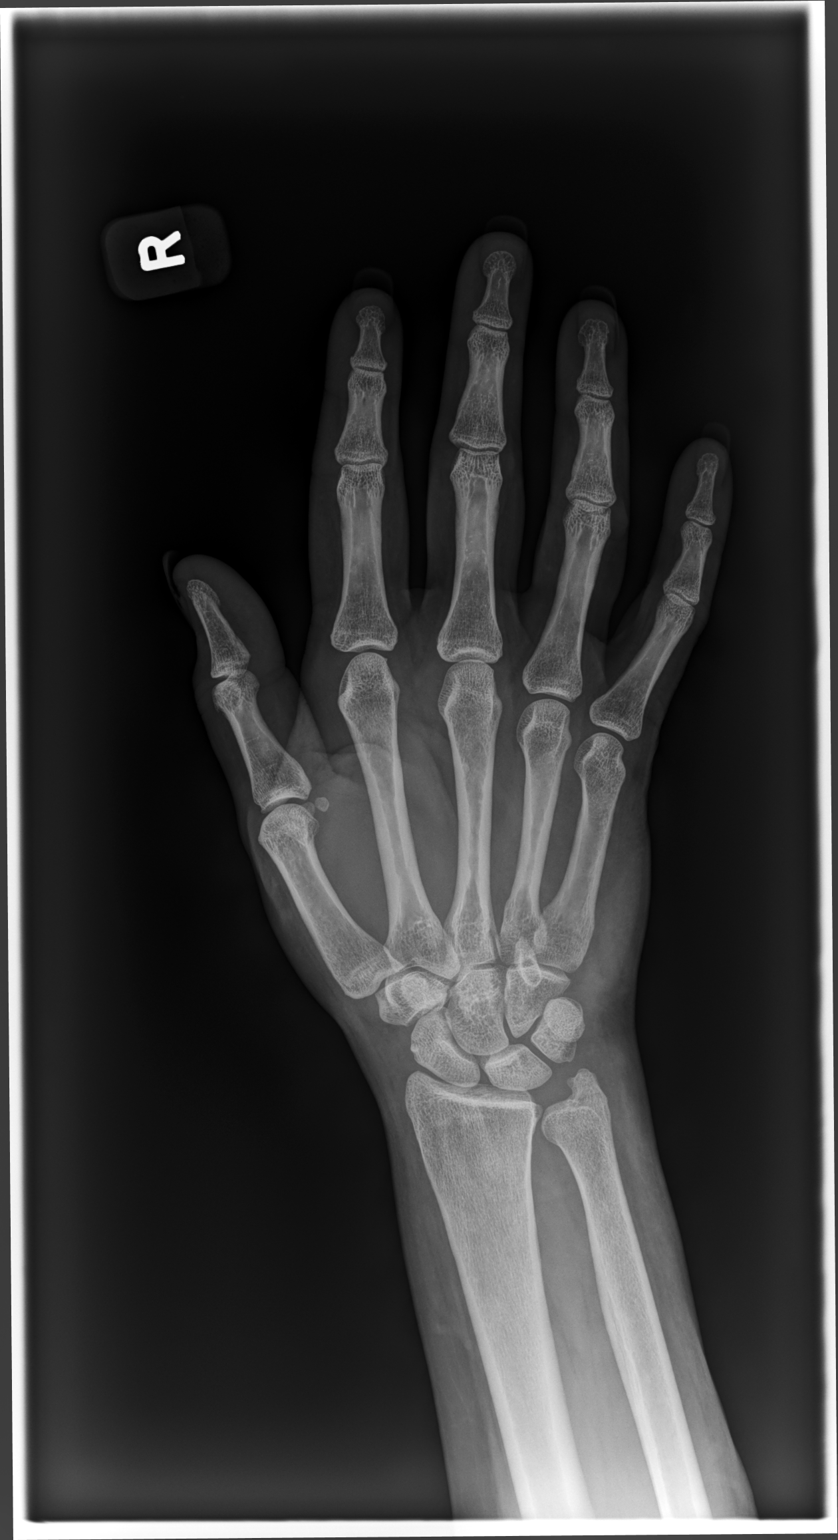

[hand mlo]
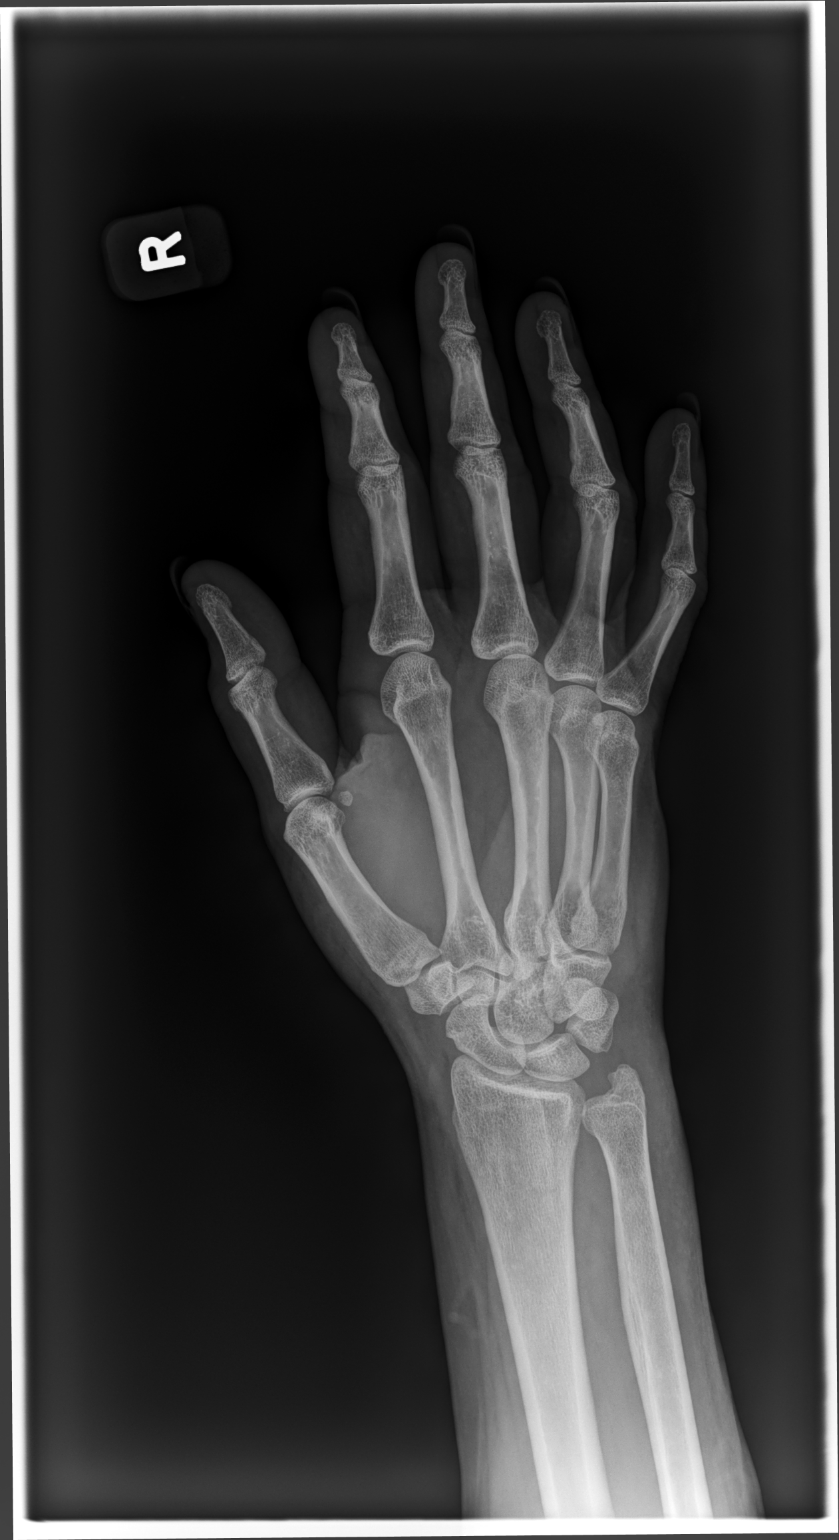

[hand lat]
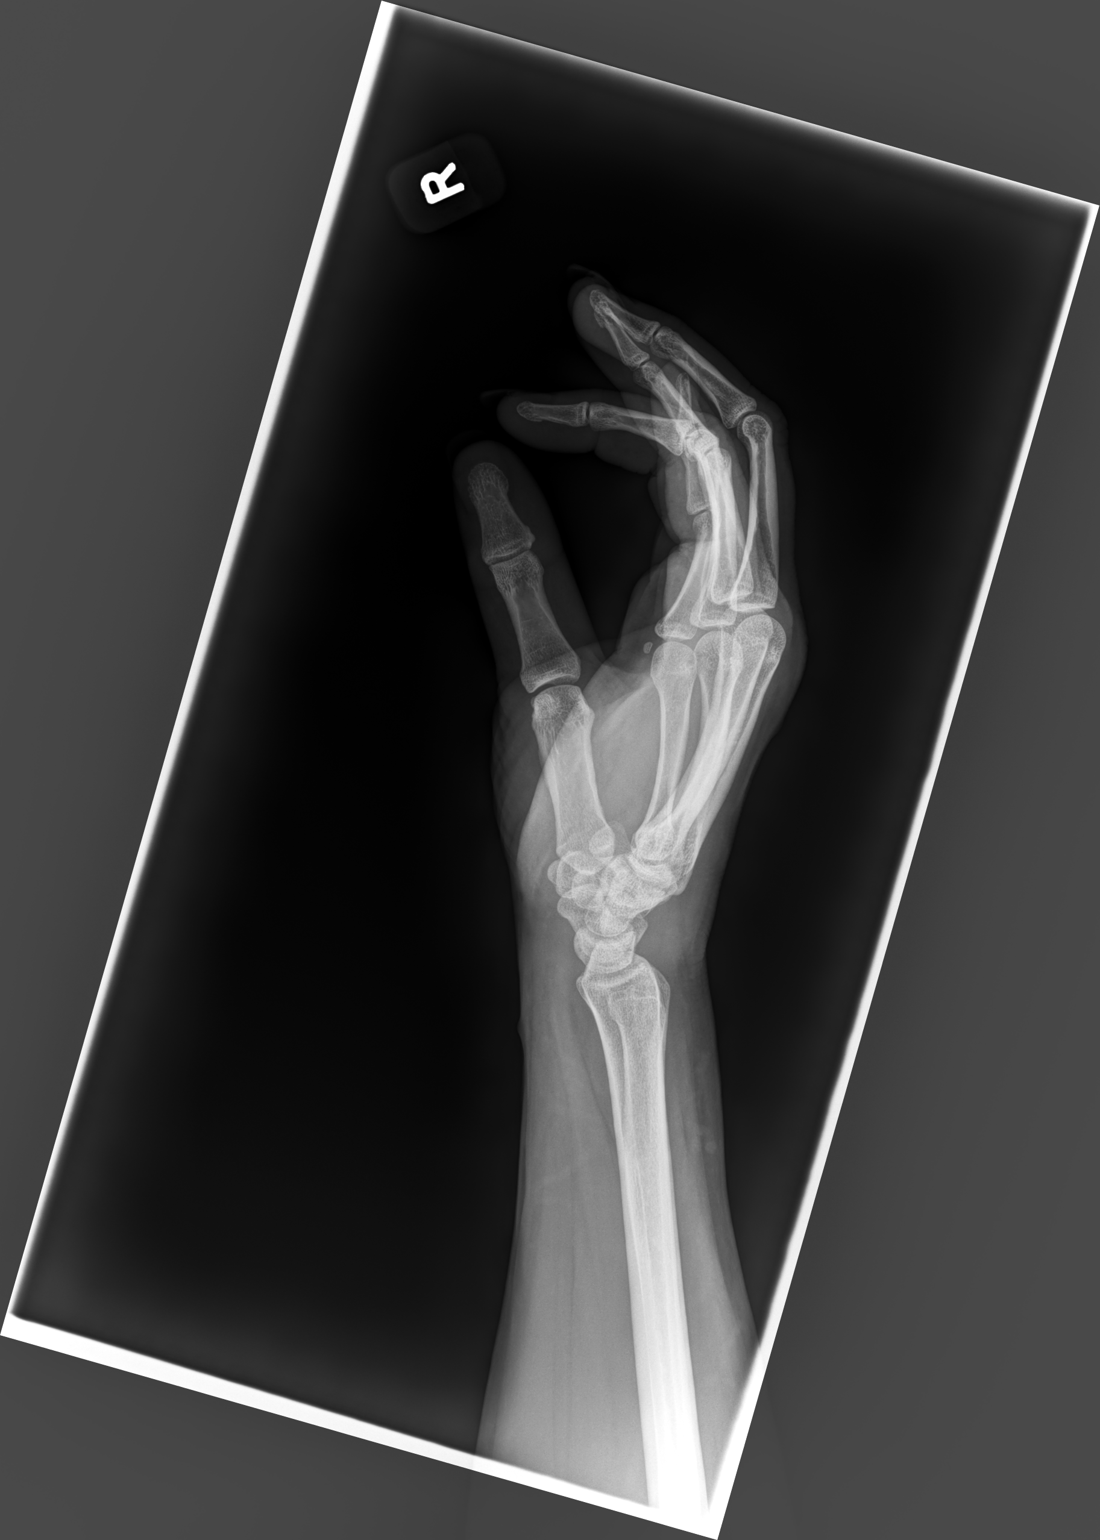

[3 of 3 positions shown; findings below may reference images not displayed]

FINDINGS: Frontal, oblique, and lateral views of the right hand demonstrate no
acute displaced fracture. Irregularity of the ulnar styloid may
reflect prior healed fracture. Alignment is anatomic. Joint spaces
are well preserved. Soft tissues are normal.
IMPRESSION: 1. No acute bony abnormality.

## 2020-04-14 ENCOUNTER — Encounter: Payer: Self-pay | Admitting: Internal Medicine

## 2020-04-14 NOTE — Telephone Encounter (Signed)
Please schedule a televisit with me. I want to discuss few things with her. I do not see any visit note with GI or CT scan that she has mentioned in her message.

## 2020-04-15 ENCOUNTER — Encounter: Payer: Self-pay | Admitting: Internal Medicine

## 2020-04-15 ENCOUNTER — Other Ambulatory Visit: Payer: Self-pay

## 2020-04-15 ENCOUNTER — Telehealth (INDEPENDENT_AMBULATORY_CARE_PROVIDER_SITE_OTHER): Payer: Medicaid Other | Admitting: Internal Medicine

## 2020-04-15 DIAGNOSIS — F339 Major depressive disorder, recurrent, unspecified: Secondary | ICD-10-CM

## 2020-04-15 DIAGNOSIS — F419 Anxiety disorder, unspecified: Secondary | ICD-10-CM

## 2020-04-15 DIAGNOSIS — K50819 Crohn's disease of both small and large intestine with unspecified complications: Secondary | ICD-10-CM

## 2020-04-15 DIAGNOSIS — K289 Gastrojejunal ulcer, unspecified as acute or chronic, without hemorrhage or perforation: Secondary | ICD-10-CM | POA: Diagnosis not present

## 2020-04-15 MED ORDER — TRAZODONE HCL 50 MG PO TABS: 25.0000 mg | ORAL_TABLET | Freq: Every evening | ORAL | 3 refills | Status: DC | PRN

## 2020-04-15 MED ORDER — HYDROXYZINE PAMOATE 25 MG PO CAPS: 25.0000 mg | ORAL_CAPSULE | Freq: Two times a day (BID) | ORAL | 2 refills | Status: DC | PRN

## 2020-04-15 NOTE — Patient Instructions (Signed)
Please decrease the dose of Zoloft to 25 mg for 1 week and then discontinue.  Please start taking Trazodone after 1 week.  Please take Vistaril for sleep and anxiety as prescribed.  Please continue to follow up with GI for Crohn's disease and gastric ulcer.

## 2020-04-15 NOTE — Progress Notes (Signed)
Virtual Visit via Telephone Note   This visit type was conducted due to national recommendations for restrictions regarding the COVID-19 Pandemic (e.g. social distancing) in an effort to limit this patient's exposure and mitigate transmission in our community.  Due to her co-morbid illnesses, this patient is at least at moderate risk for complications without adequate follow up.  This format is felt to be most appropriate for this patient at this time.  The patient did not have access to video technology/had technical difficulties with video requiring transitioning to audio format only (telephone).  All issues noted in this document were discussed and addressed.  No physical exam could be performed with this format.  Evaluation Performed:  Follow-up visit  Date:  04/15/2020   ID:  Julie [name], DOB 1975-09-11, MRN [id number]  Patient Location: Home Provider Location: Office/Clinic  Participants: Patient Location of Patient: Home Location of Provider: Telehealth Consent was obtain for visit to be over via telehealth. I verified that I am speaking with the correct person using two identifiers.  PCP:  Anabel Halon, MD   Chief Complaint:  Insomnia and depression  History of Present Illness:    Julie [name] is a 45 y.o. female with past medical history of Crohn's disease, s/p ileal resection and ileostomy closed in 2018, perforated peptic ulcer with adhesions s/p exploratory laparotomy, antrectomy with Billroth I surgery in 06/2019, and revision surgery for extensive lysis of adhesions for SBO in 08/2019, anxiety with depression and tobacco abuse who has a televisit for c/o depression and anxiety.  She was started on Zoloft in the last month, but she states that she has not had any change since starting Zoloft. Her sleep is impaired and she feels depressed during the day. She denies suicidal or homicidal ideation.  She continues to have nausea. She had recent GI visit and was referred  to ER for CT scan of the abdomen and further evaluation. She was found to have marginal ulcer and IBD related inflammation in the transverse colon. She has been placed on Augmentin and Pantoprazole 40 mg BID. She is scheduled to get EGD. She is to be placed on Stelara again.  The patient does not have symptoms concerning for COVID-19 infection (fever, chills, cough, or new shortness of breath).   Past Medical, Surgical, Social History, Allergies, and Medications have been Reviewed.  Past Medical History:  Diagnosis Date  . Acid reflux   . B12 deficiency 11/2014   initiated on IM B12 11/2014  . Chronic abdominal pain 2014  . Crohn disease (HCC) 2014   ileal and colonic dz.  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 10/2014 colonoscopy: ileal anastomotic stricture and colitis.  Remicade initiated 11/2014.  normal TPMT activity (18.3) 11/2014.  Marland Kitchen GERD (gastroesophageal reflux disease)    Phreesia 03/17/2020  . Obesity 2011   314# in 2011   Past Surgical History:  Procedure Laterality Date  . CESAREAN SECTION  2003   vaginal delivery 2000  . COLON SURGERY  2014   terminal ileum and right colon resection  . COLONOSCOPY  10/31/14   + active Crohns severe colitis and anastomotic stricture in ileum   . ESOPHAGOGASTRODUODENOSCOPY  10/2014   normal  . LAPAROSCOPIC CHOLECYSTECTOMY  2010     Current Meds  Medication Sig  . cetirizine (ZYRTEC) 10 MG tablet Take 1 tablet (10 mg total) by mouth daily.  . fluticasone (FLONASE) 50 MCG/ACT nasal spray Place 2 sprays into both nostrils daily.  Marland Kitchen  hydrOXYzine (VISTARIL) 25 MG capsule Take 1 capsule (25 mg total) by mouth every 12 (twelve) hours as needed for anxiety (Sleep).  . Lidocaine HCl 2 % CREA Apply 1 each topically 3 (three) times daily as needed.  . ondansetron (ZOFRAN-ODT) 4 MG disintegrating tablet DISSOLVE 1 TABLET IN MOUTH EVERY 8 HOURS AS NEEDED.  Marland Kitchen traZODone (DESYREL) 50 MG tablet Take 0.5-1 tablets (25-50 mg total) by mouth at bedtime  as needed for sleep.  . Vitamin D, Ergocalciferol, (DRISDOL) 1.25 MG (50000 UNIT) CAPS capsule Take 1 capsule (50,000 Units total) by mouth every 7 (seven) days.  . [DISCONTINUED] sertraline (ZOLOFT) 50 MG tablet Take 1 tablet (50 mg total) by mouth daily.     Allergies:   Sulfonamide derivatives and Sulfa antibiotics   ROS:   Please see the history of present illness.    Review of Systems  Constitutional: Negative for chills and fever.  HENT: Negative for congestion, sinus pain and sore throat.   Respiratory: Negative for cough and shortness of breath.   Cardiovascular: Negative for chest pain and palpitations.  Gastrointestinal: Positive for nausea and vomiting. Negative for blood in stool, constipation and diarrhea.  Genitourinary: Negative for dysuria and hematuria.  Psychiatric/Behavioral: Positive for depression. Negative for suicidal ideas. The patient is nervous/anxious and has insomnia.     Labs/Other Tests and Data Reviewed:    Recent Labs: 03/13/2020: ALT 14; BUN 9; Creatinine, Ser 0.72; Hemoglobin 15.1; Platelets 394; Potassium 3.3; Sodium 137; TSH 3.620   Recent Lipid Panel Lab Results  Component Value Date/Time   CHOL 96 (L) 03/13/2020 03:05 PM   TRIG 123 03/13/2020 03:05 PM   HDL 32 (L) 03/13/2020 03:05 PM   CHOLHDL 3.0 03/13/2020 03:05 PM   LDLCALC 42 03/13/2020 03:05 PM    Wt Readings from Last 3 Encounters:  03/13/20 155 lb 1.9 oz (70.4 kg)  07/16/16 172 lb 9.6 oz (78.3 kg)  11/16/15 193 lb (87.5 kg)      ASSESSMENT & PLAN:    Depression, recurrent (HCC) Taper off Zoloft Start Trazodone and Vistaril (PRN)  Anxiety Started Trazodone Vistaril PRN for anxiety/insomnia  Marginal ulcer Planned to get EGD Protonix 40 mg twice daily Compazine as needed for nausea  Crohn's disease On Augmentin for acute colitis Planned to be placed on Stelara   Time:   Today, I have spent 25 minutes reviewing the chart, including problem list, medications, and  with the patient with telehealth technology discussing the above problems.   Medication Adjustments/Labs and Tests Ordered: Current medicines are reviewed at length with the patient today.  Concerns regarding medicines are outlined above.   Tests Ordered: No orders of the defined types were placed in this encounter.   Medication Changes: Meds ordered this encounter  Medications  . traZODone (DESYREL) 50 MG tablet    Sig: Take 0.5-1 tablets (25-50 mg total) by mouth at bedtime as needed for sleep.    Dispense:  30 tablet    Refill:  3  . hydrOXYzine (VISTARIL) 25 MG capsule    Sig: Take 1 capsule (25 mg total) by mouth every 12 (twelve) hours as needed for anxiety (Sleep).    Dispense:  30 capsule    Refill:  2     Note: This dictation was prepared with Dragon dictation along with smaller phrase technology. Similar sounding words can be transcribed inadequately or may not be corrected upon review. Any transcriptional errors that result from this process are unintentional.  Disposition:  Follow up  Signed, Anabel Halon, MD  04/15/2020 10:50 AM     [facility]

## 2020-04-15 NOTE — Assessment & Plan Note (Signed)
Started Trazodone Vistaril PRN for anxiety/insomnia

## 2020-04-15 NOTE — Assessment & Plan Note (Signed)
Taper off Zoloft Start Trazodone and Vistaril (PRN)

## 2020-04-30 ENCOUNTER — Other Ambulatory Visit: Payer: Self-pay | Admitting: Internal Medicine

## 2020-04-30 DIAGNOSIS — K5080 Crohn's disease of both small and large intestine without complications: Secondary | ICD-10-CM

## 2020-04-30 DIAGNOSIS — Z9889 Other specified postprocedural states: Secondary | ICD-10-CM

## 2020-05-16 ENCOUNTER — Other Ambulatory Visit: Payer: Self-pay | Admitting: Internal Medicine

## 2020-05-16 DIAGNOSIS — K5080 Crohn's disease of both small and large intestine without complications: Secondary | ICD-10-CM

## 2020-05-16 DIAGNOSIS — Z9889 Other specified postprocedural states: Secondary | ICD-10-CM

## 2020-05-21 ENCOUNTER — Encounter: Payer: Self-pay | Admitting: Internal Medicine

## 2020-05-21 ENCOUNTER — Telehealth (INDEPENDENT_AMBULATORY_CARE_PROVIDER_SITE_OTHER): Payer: Medicaid Other | Admitting: Internal Medicine

## 2020-05-21 ENCOUNTER — Other Ambulatory Visit: Payer: Self-pay

## 2020-05-21 DIAGNOSIS — R110 Nausea: Secondary | ICD-10-CM

## 2020-05-21 DIAGNOSIS — M7989 Other specified soft tissue disorders: Secondary | ICD-10-CM | POA: Diagnosis not present

## 2020-05-21 DIAGNOSIS — K289 Gastrojejunal ulcer, unspecified as acute or chronic, without hemorrhage or perforation: Secondary | ICD-10-CM | POA: Diagnosis not present

## 2020-05-21 DIAGNOSIS — K50819 Crohn's disease of both small and large intestine with unspecified complications: Secondary | ICD-10-CM | POA: Diagnosis not present

## 2020-05-21 NOTE — Progress Notes (Signed)
Virtual Visit via Telephone Note   This visit type was conducted due to national recommendations for restrictions regarding the COVID-19 Pandemic (e.g. social distancing) in an effort to limit this patient's exposure and mitigate transmission in our community.  Due to her co-morbid illnesses, this patient is at least at moderate risk for complications without adequate follow up.  This format is felt to be most appropriate for this patient at this time.  The patient did not have access to video technology/had technical difficulties with video requiring transitioning to audio format only (telephone).  All issues noted in this document were discussed and addressed.  No physical exam could be performed with this format.  Please refer to the patient's chart for her  consent to telehealth for [facility].   Evaluation Performed:  Follow-up visit  Date:  05/21/2020   ID:  Julie [name], DOB [birth year], MRN [id number]  Patient Location: Home Provider Location: Office/Clinic  Participants: Patient Location of Patient: Home Location of Provider: Telehealth Consent was obtain for visit to be over via telehealth. I verified that I am speaking with the correct person using two identifiers.  PCP:  Anabel Halon, MD   Chief Complaint:  LE swelling and nausea  History of Present Illness:    Julie [name] is a 45 y.o. female with  past medical history of Crohn's disease, s/p ileal resection and ileostomy closed in 2018, perforated peptic ulcer with adhesions s/p exploratory laparotomy,antrectomy with Billroth I surgery in04/2021,and revision surgery for extensive lysis of adhesions for SBO in06/2021,anxiety with depression and tobacco abusewho has a televisit for c/o nausea and LE swelling.  She has been having nausea and does not feel like eating. She recently had EGD, and was found to have marginal ulcer. She has been taking Pantoprazole twice daily.  She denies any melena, hematochezia, or  hematemesis.  She c/o b/l LE swelling and her mother has noticed her having facial swelling. She denies any lip swelling, difficulty swallowing or breathing. Of note, her PO intake has been poor due to nausea.  The patient does not have symptoms concerning for COVID-19 infection (fever, chills, cough, or new shortness of breath).   Past Medical, Surgical, Social History, Allergies, and Medications have been Reviewed.  Past Medical History:  Diagnosis Date  . Acid reflux   . B12 deficiency 11/2014   initiated on IM B12 11/2014  . Chronic abdominal pain 2014  . Crohn disease (HCC) 2014   ileal and colonic dz.  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 10/2014 colonoscopy: ileal anastomotic stricture and colitis.  Remicade initiated 11/2014.  normal TPMT activity (18.3) 11/2014.  Marland Kitchen GERD (gastroesophageal reflux disease)    Phreesia 03/17/2020  . Obesity 2011   314# in 2011   Past Surgical History:  Procedure Laterality Date  . CESAREAN SECTION  2003   vaginal delivery 2000  . COLON SURGERY  2014   terminal ileum and right colon resection  . COLONOSCOPY  10/31/14   + active Crohns severe colitis and anastomotic stricture in ileum   . ESOPHAGOGASTRODUODENOSCOPY  10/2014   normal  . LAPAROSCOPIC CHOLECYSTECTOMY  2010     Current Meds  Medication Sig  . cetirizine (ZYRTEC) 10 MG tablet Take 1 tablet (10 mg total) by mouth daily.  . fluticasone (FLONASE) 50 MCG/ACT nasal spray Place 2 sprays into both nostrils daily.  . hydrOXYzine (VISTARIL) 25 MG capsule Take 1 capsule (25 mg total) by mouth every 12 (twelve) hours as  needed for anxiety (Sleep).  . Lidocaine HCl 2 % CREA Apply 1 each topically 3 (three) times daily as needed.  . ondansetron (ZOFRAN-ODT) 4 MG disintegrating tablet DISSOLVE 1 TABLET IN MOUTH EVERY 8 HOURS AS NEEDED.  Marland Kitchen pantoprazole (PROTONIX) 40 MG tablet Take 40 mg by mouth in the morning and at bedtime.  . traZODone (DESYREL) 50 MG tablet Take 0.5-1 tablets (25-50 mg  total) by mouth at bedtime as needed for sleep.  . Vitamin D, Ergocalciferol, (DRISDOL) 1.25 MG (50000 UNIT) CAPS capsule Take 1 capsule (50,000 Units total) by mouth every 7 (seven) days.     Allergies:   Sulfonamide derivatives, Other, and Sulfa antibiotics   ROS:   Please see the history of present illness.     All other systems reviewed and are negative.   Labs/Other Tests and Data Reviewed:    Recent Labs: 03/13/2020: ALT 14; BUN 9; Creatinine, Ser 0.72; Hemoglobin 15.1; Platelets 394; Potassium 3.3; Sodium 137; TSH 3.620   Recent Lipid Panel Lab Results  Component Value Date/Time   CHOL 96 (L) 03/13/2020 03:05 PM   TRIG 123 03/13/2020 03:05 PM   HDL 32 (L) 03/13/2020 03:05 PM   CHOLHDL 3.0 03/13/2020 03:05 PM   LDLCALC 42 03/13/2020 03:05 PM    Wt Readings from Last 3 Encounters:  03/13/20 155 lb 1.9 oz (70.4 kg)  07/16/16 172 lb 9.6 oz (78.3 kg)  11/16/15 193 lb (87.5 kg)      ASSESSMENT & PLAN:    Marginal ulcer On pantoprazole 40 mg twice daily Avoiding avoid, hot spicy food  Crohn's disease Has not started any maintenance therapy yet Was on stelara in the past Continue to follow up with GI  Nausea Zofran as needed  LE swelling Could be due to protein calorie malnutrition due to underlying Crohn's disease and constant nausea Advised to take protein supplements Regular diet as tolerated   Time:   Today, I have spent 16 minutes reviewing the chart, including problem list, medications, and with the patient with telehealth technology discussing the above problems.   Medication Adjustments/Labs and Tests Ordered: Current medicines are reviewed at length with the patient today.  Concerns regarding medicines are outlined above.   Tests Ordered: No orders of the defined types were placed in this encounter.   Medication Changes: No orders of the defined types were placed in this encounter.    Note: This dictation was prepared with Dragon dictation  along with smaller phrase technology. Similar sounding words can be transcribed inadequately or may not be corrected upon review. Any transcriptional errors that result from this process are unintentional.      Disposition:  Follow up  Signed, Anabel Halon, MD  05/21/2020 5:34 PM     [facility]

## 2020-05-21 NOTE — Patient Instructions (Signed)
Please continue to take Zofran as prescribed for nausea.  Please take pantoprazole twice daily as prescribed.  Avoid hot and spicy food.  Follow-up with GI as scheduled.

## 2020-06-03 ENCOUNTER — Other Ambulatory Visit: Payer: Self-pay

## 2020-06-03 ENCOUNTER — Encounter: Payer: Self-pay | Admitting: Internal Medicine

## 2020-06-03 ENCOUNTER — Ambulatory Visit (INDEPENDENT_AMBULATORY_CARE_PROVIDER_SITE_OTHER): Payer: Medicaid Other | Admitting: Internal Medicine

## 2020-06-03 VITALS — BP 103/69 | HR 122 | Resp 18 | Ht 62.0 in | Wt 131.1 lb

## 2020-06-03 DIAGNOSIS — R42 Dizziness and giddiness: Secondary | ICD-10-CM

## 2020-06-03 MED ORDER — MECLIZINE HCL 25 MG PO CHEW: 1.0000 | CHEWABLE_TABLET | Freq: Two times a day (BID) | ORAL | 0 refills | Status: DC | PRN

## 2020-06-03 NOTE — Progress Notes (Signed)
Acute Office Visit  Subjective:    Patient ID: Julie [name], female    DOB: 16-Nov-1975, 45 y.o.   MRN: [id number]  Chief Complaint  Patient presents with  . Dizziness    Pt has been dizzy and off balance since Friday fell on Sunday has been nausea and vomiting     HPI Julie [name] is a 45 y.o. female with past medical history of Crohn's disease, s/p ileal resection and ileostomy closed in 2018, perforated peptic ulcer with adhesions s/p exploratory laparotomy,antrectomy with Billroth I surgery in04/2021,and revision surgery for extensive lysis of adhesions for SBO in06/2021,anxiety with depression and tobacco abusewho presented with c/o dizziness for last 5 days.  She has been having room spinning sensation for last 5 days. Her dizziness is worse while she tries to get up from sitting position. She sustained a fall about 2 days ago, but denies any major injury. Denies any LOC. Of note, she has chronic loose BM due to her Crohn's disease. She has been taking around 2 liters of fluid intake in a day. She appears to be dehydrated today in the office although her orthostatics were negative.  She has been undergoing GI evaluation for marginal ulcer.  Past Medical History:  Diagnosis Date  . Acid reflux   . B12 deficiency 11/2014   initiated on IM B12 11/2014  . Chronic abdominal pain 2014  . Crohn disease (HCC) 2014   ileal and colonic dz.  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 10/2014 colonoscopy: ileal anastomotic stricture and colitis.  Remicade initiated 11/2014.  normal TPMT activity (18.3) 11/2014.  Marland Kitchen GERD (gastroesophageal reflux disease)    Phreesia 03/17/2020  . Obesity 2011   314# in 2011    Past Surgical History:  Procedure Laterality Date  . CESAREAN SECTION  2003   vaginal delivery 2000  . COLON SURGERY  2014   terminal ileum and right colon resection  . COLONOSCOPY  10/31/14   + active Crohns severe colitis and anastomotic stricture in ileum   .  ESOPHAGOGASTRODUODENOSCOPY  10/2014   normal  . LAPAROSCOPIC CHOLECYSTECTOMY  2010    Family History  Problem Relation Age of Onset  . Diabetes Mellitus II Mother   . Hypertension Father   . Kidney cancer Maternal Grandmother     Social History   Socioeconomic History  . Marital status: Single    Spouse name: Not on file  . Number of children: Not on file  . Years of education: Not on file  . Highest education level: Not on file  Occupational History  . Not on file  Tobacco Use  . Smoking status: Current Every Day Smoker    Packs/day: 1.00  . Smokeless tobacco: Never Used  Substance and Sexual Activity  . Alcohol use: No  . Drug use: No  . Sexual activity: Yes  Other Topics Concern  . Not on file  Social History Narrative  . Not on file   Social Determinants of Health   Financial Resource Strain: Not on file  Food Insecurity: Not on file  Transportation Needs: Not on file  Physical Activity: Not on file  Stress: Not on file  Social Connections: Not on file  Intimate Partner Violence: Not on file    Outpatient Medications Prior to Visit  Medication Sig Dispense Refill  . cetirizine (ZYRTEC) 10 MG tablet Take 1 tablet (10 mg total) by mouth daily. 30 tablet 0  . fluticasone (FLONASE) 50 MCG/ACT nasal spray Place 2 sprays  into both nostrils daily. 16 g 0  . hydrOXYzine (VISTARIL) 25 MG capsule Take 1 capsule (25 mg total) by mouth every 12 (twelve) hours as needed for anxiety (Sleep). 30 capsule 2  . Lidocaine HCl 2 % CREA Apply 1 each topically 3 (three) times daily as needed. 118 mL 0  . ondansetron (ZOFRAN-ODT) 4 MG disintegrating tablet DISSOLVE 1 TABLET IN MOUTH EVERY 8 HOURS AS NEEDED. 20 tablet 0  . pantoprazole (PROTONIX) 40 MG tablet Take 40 mg by mouth in the morning and at bedtime.    . sucralfate (CARAFATE) 1 GM/10ML suspension Take by mouth.    . traMADol (ULTRAM) 50 MG tablet Take 50 mg by mouth 2 (two) times daily as needed.    . traZODone (DESYREL)  50 MG tablet Take 0.5-1 tablets (25-50 mg total) by mouth at bedtime as needed for sleep. 30 tablet 3  . Vitamin D, Ergocalciferol, (DRISDOL) 1.25 MG (50000 UNIT) CAPS capsule Take 1 capsule (50,000 Units total) by mouth every 7 (seven) days. 12 capsule 1   No facility-administered medications prior to visit.    Allergies  Allergen Reactions  . Sulfonamide Derivatives Nausea And Vomiting  . Other Diarrhea and Rash  . Sulfa Antibiotics Diarrhea and Hives    Review of Systems  Constitutional: Positive for fatigue. Negative for chills and fever.  HENT: Negative for congestion, sinus pressure, sinus pain and sore throat.   Eyes: Negative for pain and discharge.  Respiratory: Negative for cough and shortness of breath.   Cardiovascular: Negative for chest pain and palpitations.  Gastrointestinal: Positive for nausea and vomiting. Negative for abdominal pain, constipation and diarrhea.  Endocrine: Negative for polydipsia and polyuria.  Genitourinary: Negative for dysuria and hematuria.  Musculoskeletal: Negative for neck pain and neck stiffness.  Skin: Negative for rash.  Neurological: Positive for dizziness. Negative for weakness.  Psychiatric/Behavioral: Positive for decreased concentration and sleep disturbance. Negative for agitation, behavioral problems and suicidal ideas. The patient is nervous/anxious.        Objective:    Physical Exam Vitals reviewed.  Constitutional:      General: She is not in acute distress.    Appearance: She is not diaphoretic.  HENT:     Head: Normocephalic and atraumatic.     Nose: Nose normal.     Mouth/Throat:     Mouth: Mucous membranes are dry.  Eyes:     General: No scleral icterus.    Extraocular Movements: Extraocular movements intact.     Pupils: Pupils are equal, round, and reactive to light.  Cardiovascular:     Rate and Rhythm: Regular rhythm. Tachycardia present.     Pulses: Normal pulses.     Heart sounds: Normal heart sounds. No  murmur heard.   Pulmonary:     Breath sounds: Normal breath sounds. No wheezing or rales.  Musculoskeletal:     Cervical back: Neck supple. No tenderness.     Right lower leg: No edema.     Left lower leg: No edema.  Skin:    General: Skin is warm and dry.     Findings: No rash.  Neurological:     General: No focal deficit present.     Mental Status: She is alert and oriented to person, place, and time.  Psychiatric:        Mood and Affect: Mood normal.        Behavior: Behavior normal.     BP 103/69 (BP Location: Right Arm, Patient Position: Sitting, Cuff  Size: Normal)   Pulse (!) 122   Resp 18   Ht 5\' 2"  (1.575 m)   Wt 131 lb 1.9 oz (59.5 kg)   SpO2 96%   BMI 23.98 kg/m  Wt Readings from Last 3 Encounters:  06/03/20 131 lb 1.9 oz (59.5 kg)  03/13/20 155 lb 1.9 oz (70.4 kg)  07/16/16 172 lb 9.6 oz (78.3 kg)    Health Maintenance Due  Topic Date Due  . Hepatitis C Screening  Never done  . COVID-19 Vaccine (1) Never done  . TETANUS/TDAP  Never done  . PAP SMEAR-Modifier  Never done    There are no preventive care reminders to display for this patient.   Lab Results  Component Value Date   TSH 3.620 03/13/2020   Lab Results  Component Value Date   WBC 10.2 03/13/2020   HGB 15.1 03/13/2020   HCT 46.0 03/13/2020   MCV 91 03/13/2020   PLT 394 03/13/2020   Lab Results  Component Value Date   NA 137 03/13/2020   K 3.3 (L) 03/13/2020   CO2 23 03/13/2020   GLUCOSE 101 (H) 03/13/2020   BUN 9 03/13/2020   CREATININE 0.72 03/13/2020   BILITOT 0.2 03/13/2020   ALKPHOS 152 (H) 03/13/2020   AST 17 03/13/2020   ALT 14 03/13/2020   PROT 7.2 03/13/2020   ALBUMIN 3.1 (L) 03/13/2020   CALCIUM 8.9 03/13/2020   ANIONGAP 6 07/16/2016   Lab Results  Component Value Date   CHOL 96 (L) 03/13/2020   Lab Results  Component Value Date   HDL 32 (L) 03/13/2020   Lab Results  Component Value Date   LDLCALC 42 03/13/2020   Lab Results  Component Value Date    TRIG 123 03/13/2020   Lab Results  Component Value Date   CHOLHDL 3.0 03/13/2020   Lab Results  Component Value Date   HGBA1C 5.4 03/13/2020       Assessment & Plan:   Problem List Items Addressed This Visit   None   Visit Diagnoses    Vertigo    -  Primary Dizziness appears to be combined effect of vertigo and dehydration Meclizine PRN Avoid sudden positional changes    Relevant Medications   Meclizine HCl 25 MG CHEW   Dizziness    Orthostatics negative Appears dehydrated on physical exam, skin turgor decreased Advised for proper hydration, needs to compensate extra for fluid loss due to loose BM Avoid sudden positional changes        Meds ordered this encounter  Medications  . Meclizine HCl 25 MG CHEW    Sig: Chew 1 tablet (25 mg total) by mouth 2 (two) times daily as needed.    Dispense:  60 tablet    Refill:  0     Rutwik 03/15/2020, MD

## 2020-06-03 NOTE — Patient Instructions (Signed)
Please take Meclizine as needed for dizziness and nausea as prescribed.   Please stay hydrated by taking at least 2 liters of fluid in a day. If you have loose BM or watery diarrhea, increase water intake by 1 liter.  Please avoid sudden positional changes.  Please follow up with GI as scheduled.

## 2020-06-09 ENCOUNTER — Other Ambulatory Visit: Payer: Self-pay | Admitting: Internal Medicine

## 2020-06-09 DIAGNOSIS — F419 Anxiety disorder, unspecified: Secondary | ICD-10-CM

## 2020-06-09 DIAGNOSIS — F339 Major depressive disorder, recurrent, unspecified: Secondary | ICD-10-CM

## 2020-06-11 ENCOUNTER — Other Ambulatory Visit: Payer: Self-pay

## 2020-06-11 ENCOUNTER — Telehealth (INDEPENDENT_AMBULATORY_CARE_PROVIDER_SITE_OTHER): Payer: Medicaid Other | Admitting: Internal Medicine

## 2020-06-11 ENCOUNTER — Encounter: Payer: Self-pay | Admitting: Internal Medicine

## 2020-06-11 DIAGNOSIS — R42 Dizziness and giddiness: Secondary | ICD-10-CM | POA: Diagnosis not present

## 2020-06-11 DIAGNOSIS — Z9889 Other specified postprocedural states: Secondary | ICD-10-CM | POA: Diagnosis not present

## 2020-06-11 DIAGNOSIS — K5080 Crohn's disease of both small and large intestine without complications: Secondary | ICD-10-CM

## 2020-06-11 MED ORDER — MECLIZINE HCL 25 MG PO CHEW: 1.0000 | CHEWABLE_TABLET | Freq: Three times a day (TID) | ORAL | 1 refills | Status: AC | PRN

## 2020-06-11 MED ORDER — ONDANSETRON 4 MG PO TBDP
ORAL_TABLET | ORAL | 0 refills | Status: DC
Start: 2020-06-11 — End: 2020-06-27

## 2020-06-11 NOTE — Progress Notes (Signed)
Virtual Visit via Telephone Note   This visit type was conducted due to national recommendations for restrictions regarding the COVID-19 Pandemic (e.g. social distancing) in an effort to limit this patient's exposure and mitigate transmission in our community.  Due to her co-morbid illnesses, this patient is at least at moderate risk for complications without adequate follow up.  This format is felt to be most appropriate for this patient at this time.  The patient did not have access to video technology/had technical difficulties with video requiring transitioning to audio format only (telephone).  All issues noted in this document were discussed and addressed.  No physical exam could be performed with this format.    Evaluation Performed:  Follow-up visit  Date:  06/11/2020   ID:  Julie [name], DOB 06/10/1975, MRN [id number]  Patient Location: Home Provider Location: Office/Clinic  Participants: Patient Location of Patient: Home Location of Provider: Telehealth Consent was obtain for visit to be over via telehealth. I verified that I am speaking with the correct person using two identifiers.  PCP:  Anabel Halon, MD   Chief Complaint:  Persistent nausea and dizziness  History of Present Illness:    Julie [name] is a 45 y.o. female who has a televisit for c/o persistent nausea and dizziness. She was prescribed Meclizine in the previous visit, which has helped her minimally. She still has episodes of vomiting, relieved with Zofran or Phenergan. She has not started treatment for IBD yet. She is scheduled to visit General Surgeon for evaluation of marginal ulcer. Denies any fever, chills, melena or hematochezia.  The patient does not have symptoms concerning for COVID-19 infection (fever, chills, cough, or new shortness of breath).   Past Medical, Surgical, Social History, Allergies, and Medications have been Reviewed.  Past Medical History:  Diagnosis Date  . Acid reflux   .  B12 deficiency 11/2014   initiated on IM B12 11/2014  . Chronic abdominal pain 2014  . Crohn disease (HCC) 2014   ileal and colonic dz.  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 10/2014 colonoscopy: ileal anastomotic stricture and colitis.  Remicade initiated 11/2014.  normal TPMT activity (18.3) 11/2014.  Marland Kitchen GERD (gastroesophageal reflux disease)    [employer] 03/17/2020  . Obesity 2011   314# in 2011   Past Surgical History:  Procedure Laterality Date  . CESAREAN SECTION  2003   vaginal delivery 2000  . COLON SURGERY  2014   terminal ileum and right colon resection  . COLONOSCOPY  10/31/14   + active Crohns severe colitis and anastomotic stricture in ileum   . ESOPHAGOGASTRODUODENOSCOPY  10/2014   normal  . LAPAROSCOPIC CHOLECYSTECTOMY  2010     Current Meds  Medication Sig  . cetirizine (ZYRTEC) 10 MG tablet Take 1 tablet (10 mg total) by mouth daily.  . fluticasone (FLONASE) 50 MCG/ACT nasal spray Place 2 sprays into both nostrils daily.  . hydrOXYzine (VISTARIL) 25 MG capsule TAKE (1) CAPSULE BY MOUTH EVERY 12 HOURS AS NEEDED FOR ANXIETY.  . Lidocaine HCl 2 % CREA Apply 1 each topically 3 (three) times daily as needed.  . pantoprazole (PROTONIX) 40 MG tablet Take 40 mg by mouth in the morning and at bedtime.  . sucralfate (CARAFATE) 1 GM/10ML suspension Take by mouth.  . traMADol (ULTRAM) 50 MG tablet Take 50 mg by mouth 2 (two) times daily as needed.  . traZODone (DESYREL) 50 MG tablet Take 0.5-1 tablets (25-50 mg total) by mouth at bedtime as  needed for sleep.  . Vitamin D, Ergocalciferol, (DRISDOL) 1.25 MG (50000 UNIT) CAPS capsule Take 1 capsule (50,000 Units total) by mouth every 7 (seven) days.  . [DISCONTINUED] Meclizine HCl 25 MG CHEW Chew 1 tablet (25 mg total) by mouth 2 (two) times daily as needed.  . [DISCONTINUED] ondansetron (ZOFRAN-ODT) 4 MG disintegrating tablet DISSOLVE 1 TABLET IN MOUTH EVERY 8 HOURS AS NEEDED.     Allergies:   Sulfonamide derivatives, Other,  and Sulfa antibiotics   ROS:   Please see the history of present illness.     All other systems reviewed and are negative.   Labs/Other Tests and Data Reviewed:    Recent Labs: 03/13/2020: ALT 14; BUN 9; Creatinine, Ser 0.72; Hemoglobin 15.1; Platelets 394; Potassium 3.3; Sodium 137; TSH 3.620   Recent Lipid Panel Lab Results  Component Value Date/Time   CHOL 96 (L) 03/13/2020 03:05 PM   TRIG 123 03/13/2020 03:05 PM   HDL 32 (L) 03/13/2020 03:05 PM   CHOLHDL 3.0 03/13/2020 03:05 PM   LDLCALC 42 03/13/2020 03:05 PM    Wt Readings from Last 3 Encounters:  06/03/20 131 lb 1.9 oz (59.5 kg)  03/13/20 155 lb 1.9 oz (70.4 kg)  07/16/16 172 lb 9.6 oz (78.3 kg)     ASSESSMENT & PLAN:    Marginal ulcer Pantoprazole 40 mg BID Zofran PRN Advised to avoid taking Phenergan while taking Zofran F/u with General surgeon and GI  Dizziness Appears to be related to dehydration due to underlying GI problem - marginal ulcer and IBD Proper hydration  Time:   Today, I have spent 7 minutes reviewing the chart, including problem list, medications, and with the patient with telehealth technology discussing the above problems.   Medication Adjustments/Labs and Tests Ordered: Current medicines are reviewed at length with the patient today.  Concerns regarding medicines are outlined above.   Tests Ordered: No orders of the defined types were placed in this encounter.   Medication Changes: Meds ordered this encounter  Medications  . Meclizine HCl 25 MG CHEW    Sig: Chew 1 tablet (25 mg total) by mouth 3 (three) times daily as needed.    Dispense:  60 tablet    Refill:  1  . ondansetron (ZOFRAN-ODT) 4 MG disintegrating tablet    Sig: DISSOLVE 1 TABLET IN MOUTH EVERY 8 HOURS AS NEEDED.    Dispense:  30 tablet    Refill:  0     Note: This dictation was prepared with Dragon dictation along with smaller phrase technology. Similar sounding words can be transcribed inadequately or may not  be corrected upon review. Any transcriptional errors that result from this process are unintentional.      Disposition:  Follow up  Signed, Anabel Halon, MD  06/11/2020 8:44 AM     [facility]

## 2020-06-11 NOTE — Patient Instructions (Signed)
Please take Zofran for nausea/vomiting. Avoid taking Phenergan while taking Zofran.  Continue to follow up with GI and General Surgeon as scheduled.  Maintain proper hydration by taking at least 2 l of fluid. You may require additional fluid if you have loose BM or watery diarrhea.

## 2020-06-13 ENCOUNTER — Other Ambulatory Visit: Payer: Self-pay | Admitting: Internal Medicine

## 2020-06-13 DIAGNOSIS — E559 Vitamin D deficiency, unspecified: Secondary | ICD-10-CM

## 2020-06-27 ENCOUNTER — Other Ambulatory Visit: Payer: Self-pay | Admitting: Internal Medicine

## 2020-06-27 DIAGNOSIS — K5080 Crohn's disease of both small and large intestine without complications: Secondary | ICD-10-CM

## 2020-06-27 DIAGNOSIS — Z9889 Other specified postprocedural states: Secondary | ICD-10-CM

## 2020-08-22 ENCOUNTER — Other Ambulatory Visit: Payer: Self-pay | Admitting: Internal Medicine

## 2020-08-22 DIAGNOSIS — K5080 Crohn's disease of both small and large intestine without complications: Secondary | ICD-10-CM

## 2020-08-22 DIAGNOSIS — Z9889 Other specified postprocedural states: Secondary | ICD-10-CM

## 2020-08-22 DIAGNOSIS — F339 Major depressive disorder, recurrent, unspecified: Secondary | ICD-10-CM

## 2020-09-11 ENCOUNTER — Ambulatory Visit: Payer: Medicaid Other | Admitting: Nurse Practitioner

## 2020-09-24 ENCOUNTER — Other Ambulatory Visit: Payer: Self-pay | Admitting: Internal Medicine

## 2020-09-24 DIAGNOSIS — K5080 Crohn's disease of both small and large intestine without complications: Secondary | ICD-10-CM

## 2020-09-24 DIAGNOSIS — F339 Major depressive disorder, recurrent, unspecified: Secondary | ICD-10-CM

## 2020-09-24 DIAGNOSIS — Z9889 Other specified postprocedural states: Secondary | ICD-10-CM

## 2020-10-20 ENCOUNTER — Other Ambulatory Visit: Payer: Self-pay | Admitting: Internal Medicine

## 2020-10-20 DIAGNOSIS — Z9889 Other specified postprocedural states: Secondary | ICD-10-CM

## 2020-10-20 DIAGNOSIS — F339 Major depressive disorder, recurrent, unspecified: Secondary | ICD-10-CM

## 2020-10-20 DIAGNOSIS — K5080 Crohn's disease of both small and large intestine without complications: Secondary | ICD-10-CM

## 2020-11-19 ENCOUNTER — Other Ambulatory Visit: Payer: Self-pay | Admitting: Family Medicine

## 2020-11-19 DIAGNOSIS — F339 Major depressive disorder, recurrent, unspecified: Secondary | ICD-10-CM

## 2020-12-18 ENCOUNTER — Other Ambulatory Visit: Payer: Self-pay | Admitting: Family Medicine

## 2020-12-18 DIAGNOSIS — F339 Major depressive disorder, recurrent, unspecified: Secondary | ICD-10-CM

## 2021-01-19 ENCOUNTER — Other Ambulatory Visit: Payer: Self-pay | Admitting: Family Medicine

## 2021-01-19 DIAGNOSIS — F339 Major depressive disorder, recurrent, unspecified: Secondary | ICD-10-CM

## 2021-01-22 ENCOUNTER — Other Ambulatory Visit: Payer: Self-pay | Admitting: Internal Medicine

## 2021-01-22 DIAGNOSIS — F419 Anxiety disorder, unspecified: Secondary | ICD-10-CM

## 2021-01-22 DIAGNOSIS — F339 Major depressive disorder, recurrent, unspecified: Secondary | ICD-10-CM

## 2021-02-09 ENCOUNTER — Ambulatory Visit: Payer: Medicaid Other | Admitting: Internal Medicine

## 2021-06-08 ENCOUNTER — Telehealth: Payer: Self-pay

## 2021-06-08 NOTE — Telephone Encounter (Signed)
Julie [name] called from [facility], patient will be discharged from [facility] around 03.22.2023 needs to verify if Dr Posey Pronto will follow up the patient will be going home with a trach, ventilator and central line [place]. [place] call back # [phone]. ?

## 2021-06-09 NOTE — Telephone Encounter (Signed)
Verbal given that patel will follow pt upon discharge  ?

## 2021-06-19 ENCOUNTER — Telehealth: Payer: Self-pay

## 2021-06-19 NOTE — Telephone Encounter (Signed)
Transition Care Management Unsuccessful Follow-up Telephone Call ? ?Date of discharge and from where:  06/18/21 [facility]  ? ?Attempts:  1st Attempt ? ?Reason for unsuccessful TCM follow-up call:  Left voice message ? ?  ?

## 2021-06-22 ENCOUNTER — Telehealth: Payer: Medicaid Other | Admitting: Internal Medicine

## 2021-06-22 ENCOUNTER — Other Ambulatory Visit: Payer: Self-pay

## 2021-06-30 DIAGNOSIS — Z9911 Dependence on respirator [ventilator] status: Secondary | ICD-10-CM

## 2021-06-30 DIAGNOSIS — Z930 Tracheostomy status: Secondary | ICD-10-CM

## 2021-06-30 DIAGNOSIS — K50919 Crohn's disease, unspecified, with unspecified complications: Secondary | ICD-10-CM

## 2021-06-30 DIAGNOSIS — K5090 Crohn's disease, unspecified, without complications: Secondary | ICD-10-CM

## 2021-06-30 NOTE — Telephone Encounter (Signed)
ERROR

## 2021-07-01 DIAGNOSIS — Z930 Tracheostomy status: Secondary | ICD-10-CM | POA: Diagnosis not present

## 2021-07-01 DIAGNOSIS — Z9911 Dependence on respirator [ventilator] status: Secondary | ICD-10-CM | POA: Diagnosis not present

## 2021-07-01 DIAGNOSIS — K50919 Crohn's disease, unspecified, with unspecified complications: Secondary | ICD-10-CM | POA: Diagnosis not present

## 2021-07-01 DIAGNOSIS — K5090 Crohn's disease, unspecified, without complications: Secondary | ICD-10-CM | POA: Diagnosis not present

## 2021-07-06 DIAGNOSIS — Z930 Tracheostomy status: Secondary | ICD-10-CM | POA: Diagnosis not present

## 2021-07-06 DIAGNOSIS — K50919 Crohn's disease, unspecified, with unspecified complications: Secondary | ICD-10-CM | POA: Diagnosis not present

## 2021-07-06 DIAGNOSIS — Z9911 Dependence on respirator [ventilator] status: Secondary | ICD-10-CM | POA: Diagnosis not present

## 2021-07-06 DIAGNOSIS — K5090 Crohn's disease, unspecified, without complications: Secondary | ICD-10-CM | POA: Diagnosis not present
